# Patient Record
Sex: Male | Born: 1935 | Race: White | Hispanic: No | Smoking: Current every day smoker
Health system: Southern US, Community
[De-identification: ages and names within clinical notes are randomized; demographics above are authoritative.]

## PROBLEM LIST (undated history)

## (undated) DIAGNOSIS — Z720 Tobacco use: Secondary | ICD-10-CM

## (undated) DIAGNOSIS — D049 Carcinoma in situ of skin, unspecified: Secondary | ICD-10-CM

## (undated) DIAGNOSIS — J449 Chronic obstructive pulmonary disease, unspecified: Secondary | ICD-10-CM

## (undated) DIAGNOSIS — G8929 Other chronic pain: Secondary | ICD-10-CM

## (undated) DIAGNOSIS — I10 Essential (primary) hypertension: Secondary | ICD-10-CM

---

## 2017-09-05 ENCOUNTER — Inpatient Hospital Stay (HOSPITAL_COMMUNITY)
Admission: EM | Admit: 2017-09-05 | Discharge: 2017-09-13 | DRG: 082 | Disposition: E | Payer: Medicare Other | Attending: Pulmonary Disease | Admitting: Pulmonary Disease

## 2017-09-05 ENCOUNTER — Emergency Department (HOSPITAL_COMMUNITY): Payer: Medicare Other

## 2017-09-05 ENCOUNTER — Other Ambulatory Visit: Payer: Self-pay

## 2017-09-05 ENCOUNTER — Encounter (HOSPITAL_COMMUNITY): Payer: Self-pay | Admitting: Neurological Surgery

## 2017-09-05 DIAGNOSIS — Z9289 Personal history of other medical treatment: Secondary | ICD-10-CM

## 2017-09-05 DIAGNOSIS — S06369A Traumatic hemorrhage of cerebrum, unspecified, with loss of consciousness of unspecified duration, initial encounter: Secondary | ICD-10-CM | POA: Diagnosis present

## 2017-09-05 DIAGNOSIS — Z66 Do not resuscitate: Secondary | ICD-10-CM | POA: Diagnosis not present

## 2017-09-05 DIAGNOSIS — W07XXXA Fall from chair, initial encounter: Secondary | ICD-10-CM | POA: Diagnosis present

## 2017-09-05 DIAGNOSIS — Y95 Nosocomial condition: Secondary | ICD-10-CM | POA: Diagnosis not present

## 2017-09-05 DIAGNOSIS — R579 Shock, unspecified: Secondary | ICD-10-CM | POA: Diagnosis not present

## 2017-09-05 DIAGNOSIS — Z7189 Other specified counseling: Secondary | ICD-10-CM | POA: Diagnosis not present

## 2017-09-05 DIAGNOSIS — R197 Diarrhea, unspecified: Secondary | ICD-10-CM

## 2017-09-05 DIAGNOSIS — F17210 Nicotine dependence, cigarettes, uncomplicated: Secondary | ICD-10-CM | POA: Diagnosis present

## 2017-09-05 DIAGNOSIS — I959 Hypotension, unspecified: Secondary | ICD-10-CM | POA: Diagnosis present

## 2017-09-05 DIAGNOSIS — I10 Essential (primary) hypertension: Secondary | ICD-10-CM | POA: Diagnosis not present

## 2017-09-05 DIAGNOSIS — S066X9A Traumatic subarachnoid hemorrhage with loss of consciousness of unspecified duration, initial encounter: Principal | ICD-10-CM | POA: Diagnosis present

## 2017-09-05 DIAGNOSIS — R4781 Slurred speech: Secondary | ICD-10-CM | POA: Diagnosis present

## 2017-09-05 DIAGNOSIS — R29810 Facial weakness: Secondary | ICD-10-CM | POA: Diagnosis present

## 2017-09-05 DIAGNOSIS — I472 Ventricular tachycardia: Secondary | ICD-10-CM | POA: Diagnosis not present

## 2017-09-05 DIAGNOSIS — D649 Anemia, unspecified: Secondary | ICD-10-CM | POA: Diagnosis present

## 2017-09-05 DIAGNOSIS — R569 Unspecified convulsions: Secondary | ICD-10-CM | POA: Diagnosis present

## 2017-09-05 DIAGNOSIS — G9349 Other encephalopathy: Secondary | ICD-10-CM | POA: Diagnosis present

## 2017-09-05 DIAGNOSIS — J690 Pneumonitis due to inhalation of food and vomit: Secondary | ICD-10-CM | POA: Diagnosis not present

## 2017-09-05 DIAGNOSIS — I619 Nontraumatic intracerebral hemorrhage, unspecified: Secondary | ICD-10-CM | POA: Diagnosis not present

## 2017-09-05 DIAGNOSIS — I878 Other specified disorders of veins: Secondary | ICD-10-CM | POA: Diagnosis present

## 2017-09-05 DIAGNOSIS — G9340 Encephalopathy, unspecified: Secondary | ICD-10-CM | POA: Diagnosis not present

## 2017-09-05 DIAGNOSIS — I609 Nontraumatic subarachnoid hemorrhage, unspecified: Secondary | ICD-10-CM | POA: Diagnosis not present

## 2017-09-05 DIAGNOSIS — E876 Hypokalemia: Secondary | ICD-10-CM | POA: Diagnosis not present

## 2017-09-05 DIAGNOSIS — J9622 Acute and chronic respiratory failure with hypercapnia: Secondary | ICD-10-CM

## 2017-09-05 DIAGNOSIS — J9601 Acute respiratory failure with hypoxia: Secondary | ICD-10-CM | POA: Diagnosis not present

## 2017-09-05 DIAGNOSIS — S065X9A Traumatic subdural hemorrhage with loss of consciousness of unspecified duration, initial encounter: Secondary | ICD-10-CM | POA: Diagnosis not present

## 2017-09-05 DIAGNOSIS — J449 Chronic obstructive pulmonary disease, unspecified: Secondary | ICD-10-CM | POA: Diagnosis not present

## 2017-09-05 DIAGNOSIS — Z79891 Long term (current) use of opiate analgesic: Secondary | ICD-10-CM

## 2017-09-05 DIAGNOSIS — R4182 Altered mental status, unspecified: Secondary | ICD-10-CM | POA: Diagnosis present

## 2017-09-05 DIAGNOSIS — I4891 Unspecified atrial fibrillation: Secondary | ICD-10-CM

## 2017-09-05 DIAGNOSIS — R55 Syncope and collapse: Secondary | ICD-10-CM | POA: Diagnosis not present

## 2017-09-05 DIAGNOSIS — Y92099 Unspecified place in other non-institutional residence as the place of occurrence of the external cause: Secondary | ICD-10-CM | POA: Diagnosis not present

## 2017-09-05 DIAGNOSIS — B9689 Other specified bacterial agents as the cause of diseases classified elsewhere: Secondary | ICD-10-CM | POA: Diagnosis present

## 2017-09-05 DIAGNOSIS — R6520 Severe sepsis without septic shock: Secondary | ICD-10-CM | POA: Diagnosis not present

## 2017-09-05 DIAGNOSIS — A419 Sepsis, unspecified organism: Secondary | ICD-10-CM | POA: Diagnosis not present

## 2017-09-05 DIAGNOSIS — G8929 Other chronic pain: Secondary | ICD-10-CM | POA: Diagnosis present

## 2017-09-05 DIAGNOSIS — R296 Repeated falls: Secondary | ICD-10-CM | POA: Diagnosis present

## 2017-09-05 DIAGNOSIS — S01112A Laceration without foreign body of left eyelid and periocular area, initial encounter: Secondary | ICD-10-CM | POA: Diagnosis present

## 2017-09-05 DIAGNOSIS — S065XAA Traumatic subdural hemorrhage with loss of consciousness status unknown, initial encounter: Secondary | ICD-10-CM

## 2017-09-05 DIAGNOSIS — Z515 Encounter for palliative care: Secondary | ICD-10-CM | POA: Diagnosis not present

## 2017-09-05 DIAGNOSIS — E872 Acidosis: Secondary | ICD-10-CM | POA: Diagnosis present

## 2017-09-05 HISTORY — DX: Essential (primary) hypertension: I10

## 2017-09-05 HISTORY — DX: Chronic obstructive pulmonary disease, unspecified: J44.9

## 2017-09-05 HISTORY — DX: Carcinoma in situ of skin, unspecified: D04.9

## 2017-09-05 HISTORY — DX: Tobacco use: Z72.0

## 2017-09-05 HISTORY — DX: Other chronic pain: G89.29

## 2017-09-05 LAB — I-STAT ARTERIAL BLOOD GAS, ED
Acid-base deficit: 2 mmol/L (ref 0.0–2.0)
Bicarbonate: 27.6 mmol/L (ref 20.0–28.0)
O2 SAT: 100 %
PCO2 ART: 67 mmHg — AB (ref 32.0–48.0)
PH ART: 7.222 — AB (ref 7.350–7.450)
TCO2: 30 mmol/L (ref 22–32)
pO2, Arterial: 287 mmHg — ABNORMAL HIGH (ref 83.0–108.0)

## 2017-09-05 LAB — COMPREHENSIVE METABOLIC PANEL
ALT: 20 U/L (ref 17–63)
ANION GAP: 9 (ref 5–15)
AST: 35 U/L (ref 15–41)
Albumin: 3.1 g/dL — ABNORMAL LOW (ref 3.5–5.0)
Alkaline Phosphatase: 135 U/L — ABNORMAL HIGH (ref 38–126)
BILIRUBIN TOTAL: 0.7 mg/dL (ref 0.3–1.2)
BUN: 15 mg/dL (ref 6–20)
CHLORIDE: 105 mmol/L (ref 101–111)
CO2: 24 mmol/L (ref 22–32)
Calcium: 8.6 mg/dL — ABNORMAL LOW (ref 8.9–10.3)
Creatinine, Ser: 0.69 mg/dL (ref 0.61–1.24)
Glucose, Bld: 146 mg/dL — ABNORMAL HIGH (ref 65–99)
POTASSIUM: 3.7 mmol/L (ref 3.5–5.1)
Sodium: 138 mmol/L (ref 135–145)
TOTAL PROTEIN: 6.6 g/dL (ref 6.5–8.1)

## 2017-09-05 LAB — CBC
HEMATOCRIT: 37.6 % — AB (ref 39.0–52.0)
Hemoglobin: 12.2 g/dL — ABNORMAL LOW (ref 13.0–17.0)
MCH: 27.4 pg (ref 26.0–34.0)
MCHC: 32.4 g/dL (ref 30.0–36.0)
MCV: 84.5 fL (ref 78.0–100.0)
Platelets: 190 10*3/uL (ref 150–400)
RBC: 4.45 MIL/uL (ref 4.22–5.81)
RDW: 15.8 % — AB (ref 11.5–15.5)
WBC: 9 10*3/uL (ref 4.0–10.5)

## 2017-09-05 LAB — CBG MONITORING, ED: GLUCOSE-CAPILLARY: 160 mg/dL — AB (ref 65–99)

## 2017-09-05 LAB — ETHANOL: Alcohol, Ethyl (B): <10 mg/dL (ref <10)

## 2017-09-05 LAB — DIFFERENTIAL
BASOS ABS: 0.1 10*3/uL (ref 0.0–0.1)
BASOS PCT: 1 %
EOS ABS: 0.2 10*3/uL (ref 0.0–0.7)
EOS PCT: 2 %
Lymphocytes Relative: 31 %
Lymphs Abs: 2.8 10*3/uL (ref 0.7–4.0)
MONOS PCT: 6 %
Monocytes Absolute: 0.6 10*3/uL (ref 0.1–1.0)
Neutro Abs: 5.4 10*3/uL (ref 1.7–7.7)
Neutrophils Relative %: 60 %

## 2017-09-05 LAB — RAPID URINE DRUG SCREEN, HOSP PERFORMED
Amphetamines: NOT DETECTED
BARBITURATES: NOT DETECTED
BENZODIAZEPINES: NOT DETECTED
COCAINE: NOT DETECTED
OPIATES: NOT DETECTED
TETRAHYDROCANNABINOL: NOT DETECTED

## 2017-09-05 LAB — I-STAT CHEM 8, ED
BUN: 14 mg/dL (ref 6–20)
CALCIUM ION: 1.15 mmol/L (ref 1.15–1.40)
CREATININE: 0.6 mg/dL — AB (ref 0.61–1.24)
Chloride: 105 mmol/L (ref 101–111)
GLUCOSE: 149 mg/dL — AB (ref 65–99)
HCT: 36 % — ABNORMAL LOW (ref 39.0–52.0)
HEMOGLOBIN: 12.2 g/dL — AB (ref 13.0–17.0)
Potassium: 3.8 mmol/L (ref 3.5–5.1)
Sodium: 141 mmol/L (ref 135–145)
TCO2: 28 mmol/L (ref 22–32)

## 2017-09-05 LAB — APTT: APTT: 27 s (ref 24–36)

## 2017-09-05 LAB — I-STAT TROPONIN, ED: TROPONIN I, POC: 0 ng/mL (ref 0.00–0.08)

## 2017-09-05 LAB — PROTIME-INR
INR: 1.01
Prothrombin Time: 13.2 seconds (ref 11.4–15.2)

## 2017-09-05 LAB — TRIGLYCERIDES: Triglycerides: 91 mg/dL (ref <150)

## 2017-09-05 MED ORDER — SODIUM CHLORIDE 0.9 % IV SOLN
2000.0000 mg | Freq: Once | INTRAVENOUS | Status: AC
  Administered 2017-09-05: 2000 mg via INTRAVENOUS
  Filled 2017-09-05: qty 20

## 2017-09-05 MED ORDER — PANTOPRAZOLE SODIUM 40 MG IV SOLR
40.0000 mg | Freq: Every day | INTRAVENOUS | Status: DC
  Administered 2017-09-06 – 2017-09-08 (×4): 40 mg via INTRAVENOUS
  Filled 2017-09-05 (×5): qty 40

## 2017-09-05 MED ORDER — SODIUM CHLORIDE 0.9 % IV SOLN
INTRAVENOUS | Status: DC
  Administered 2017-09-06 – 2017-09-07 (×2): via INTRAVENOUS

## 2017-09-05 MED ORDER — IPRATROPIUM BROMIDE 0.02 % IN SOLN
0.5000 mg | Freq: Three times a day (TID) | RESPIRATORY_TRACT | Status: DC
  Administered 2017-09-06 – 2017-09-09 (×12): 0.5 mg via RESPIRATORY_TRACT
  Filled 2017-09-05 (×12): qty 2.5

## 2017-09-05 MED ORDER — SODIUM CHLORIDE 0.9 % IV SOLN: 250.0000 mL | INTRAVENOUS | Status: DC | PRN

## 2017-09-05 MED ORDER — ROCURONIUM BROMIDE 50 MG/5ML IV SOLN
100.0000 mg | Freq: Once | INTRAVENOUS | Status: AC
  Administered 2017-09-05: 100 mg via INTRAVENOUS

## 2017-09-05 MED ORDER — METOPROLOL TARTRATE 5 MG/5ML IV SOLN
2.5000 mg | INTRAVENOUS | Status: DC | PRN
  Filled 2017-09-05: qty 5

## 2017-09-05 MED ORDER — SODIUM CHLORIDE 0.9 % IV SOLN
500.0000 mg | Freq: Two times a day (BID) | INTRAVENOUS | Status: DC
  Administered 2017-09-06 – 2017-09-09 (×8): 500 mg via INTRAVENOUS
  Filled 2017-09-05 (×10): qty 5

## 2017-09-05 MED ORDER — FENTANYL CITRATE (PF) 100 MCG/2ML IJ SOLN
50.0000 ug | INTRAMUSCULAR | Status: DC | PRN
  Administered 2017-09-06 – 2017-09-07 (×6): 50 ug via INTRAVENOUS
  Filled 2017-09-05 (×5): qty 2

## 2017-09-05 MED ORDER — LORAZEPAM 2 MG/ML IJ SOLN
1.0000 mg | INTRAMUSCULAR | Status: DC | PRN
  Administered 2017-09-08: 2 mg via INTRAVENOUS
  Filled 2017-09-05: qty 1

## 2017-09-05 MED ORDER — PROPOFOL 1000 MG/100ML IV EMUL
5.0000 ug/kg/min | Freq: Once | INTRAVENOUS | Status: AC
  Administered 2017-09-05: 5 ug/kg/min via INTRAVENOUS
  Filled 2017-09-05: qty 100

## 2017-09-05 MED ORDER — BUDESONIDE 0.5 MG/2ML IN SUSP
0.5000 mg | Freq: Two times a day (BID) | RESPIRATORY_TRACT | Status: DC
Start: 2017-09-05 — End: 2017-09-09
  Administered 2017-09-06 – 2017-09-09 (×8): 0.5 mg via RESPIRATORY_TRACT
  Filled 2017-09-05 (×9): qty 2

## 2017-09-05 MED ORDER — LORAZEPAM 2 MG/ML IJ SOLN
1.0000 mg | Freq: Once | INTRAMUSCULAR | Status: AC
  Administered 2017-09-05: 1 mg via INTRAVENOUS

## 2017-09-05 MED ORDER — FENTANYL CITRATE (PF) 100 MCG/2ML IJ SOLN
50.0000 ug | INTRAMUSCULAR | Status: DC | PRN
  Administered 2017-09-07: 50 ug via INTRAVENOUS

## 2017-09-05 MED ORDER — ETOMIDATE 2 MG/ML IV SOLN
10.0000 mg | Freq: Once | INTRAVENOUS | Status: AC
  Administered 2017-09-05: 10 mg via INTRAVENOUS

## 2017-09-05 MED ORDER — ONDANSETRON HCL 4 MG/2ML IJ SOLN
4.0000 mg | Freq: Once | INTRAMUSCULAR | Status: AC
  Administered 2017-09-05: 4 mg via INTRAVENOUS
  Filled 2017-09-05: qty 2

## 2017-09-05 MED ORDER — IOPAMIDOL (ISOVUE-370) INJECTION 76%
INTRAVENOUS | Status: AC
  Administered 2017-09-05: 50 mL
  Filled 2017-09-05: qty 50

## 2017-09-05 MED ORDER — SODIUM CHLORIDE 0.9 % IV SOLN: 1000.0000 mg | Freq: Once | INTRAVENOUS | Status: DC

## 2017-09-05 MED ORDER — PROPOFOL 1000 MG/100ML IV EMUL
0.0000 ug/kg/min | INTRAVENOUS | Status: DC
  Administered 2017-09-06: 25 ug/kg/min via INTRAVENOUS
  Filled 2017-09-05: qty 100

## 2017-09-05 MED ORDER — LORAZEPAM 2 MG/ML IJ SOLN
INTRAMUSCULAR | Status: AC
Start: 2017-09-05 — End: 2017-09-06
  Filled 2017-09-05: qty 1

## 2017-09-05 MED ORDER — ARFORMOTEROL TARTRATE 15 MCG/2ML IN NEBU
15.0000 ug | INHALATION_SOLUTION | Freq: Two times a day (BID) | RESPIRATORY_TRACT | Status: DC
  Administered 2017-09-06 – 2017-09-09 (×8): 15 ug via RESPIRATORY_TRACT
  Filled 2017-09-05 (×9): qty 2

## 2017-09-05 MED ORDER — IPRATROPIUM BROMIDE 0.02 % IN SOLN
0.5000 mg | Freq: Three times a day (TID) | RESPIRATORY_TRACT | Status: DC
  Administered 2017-09-05: 0.5 mg via RESPIRATORY_TRACT
  Filled 2017-09-05: qty 2.5

## 2017-09-05 NOTE — Consult Note (Signed)
Reason for Consult:SAH, SDH Referring Physician: EDP  Douglas JanWilliam Arnold is an 84 y.o. male.   HPI:  84 yo male seen in ER today with slurred speech and facial droop. Last seen normal some time yesterday by his family. Has had multiple recent falls and admitted to SNF in the last two weeks. [facility] SeatFell again there today and had some confusion with slurred speech. Nonverbal and following some simple commands on arrival to the ED. Ct showed SAH and SDH, recommended CTA. Pt had a sz and was sedated and intubated, therefore no history or exam available. History taken by EDP and nursing staff.   History reviewed. No pertinent past medical history.  History reviewed. No pertinent surgical history.  Not on File  Social History   Tobacco Use  . Smoking status: Not on file  Substance Use Topics  . Alcohol use: Not on file    History reviewed. No pertinent family history.   Review of Systems  Positive ROS: unable to obtain  All other systems have been reviewed and were otherwise negative with the exception of those mentioned in the HPI and as above.  Objective: Vital signs in last 24 hours: Temp:  [97.9 F (36.6 C)] 97.9 F (36.6 C) (01/24 1939) Pulse Rate:  [93-101] 101 (01/24 2051) Resp:  [17-18] 17 (01/24 2051) BP: (167)/(87) 167/87 (01/24 1939) SpO2:  [92 %-100 %] 100 % (01/24 2051) FiO2 (%):  [100 %] 100 % (01/24 2051) Weight:  [78.8 kg (173 lb 11.6 oz)-81.6 kg (179 lb 14.3 oz)] 78.8 kg (173 lb 11.6 oz) (01/24 2101)  General Appearance: intubated and sedated Head: Normocephalic, without obvious abnormality, atraumatic Eyes: PERRL      NEUROLOGIC:   Mental status: intubated and sedated Motor Exam - unable to obtain Sensory Exam -unable to obtain Reflexes: Gait -not tested Balance - not tested Cranial Nerves: I: smell Not tested  II: visual acuity  OS: na    OD: na  II: visual fields   II: pupils   III,VII: ptosis   III,IV,VI: extraocular muscles    V: mastication   V: facial  light touch sensation    V,VII: corneal reflex    VII: facial muscle function - upper    VII: facial muscle function - lower   VIII: hearing   IX: soft palate elevation    IX,X: gag reflex present  XI: trapezius strength    XI: sternocleidomastoid strength   XI: neck flexion strength    XII: tongue strength      Data Review Lab Results  Component Value Date   WBC 9.0 08/22/2017   HGB 12.2 (L) 08/31/2017   HCT 36.0 (L) 08/27/2017   MCV 84.5 09/04/2017   PLT 190 09/12/2017   Lab Results  Component Value Date   NA 141 09/04/2017   K 3.8 08/28/2017   CL 105 09/04/2017   CO2 24 08/25/2017   BUN 14 08/25/2017   CREATININE 0.60 (L) 09/04/2017   GLUCOSE 149 (H) 08/27/2017   Lab Results  Component Value Date   INR 1.01 08/28/2017    Radiology: Ct Cervical Spine Wo Contrast  Result Date: 09/06/2017 CLINICAL DATA:  Code stroke. EXAM: CT HEAD WITHOUT CONTRAST CT CERVICAL SPINE WITHOUT CONTRAST TECHNIQUE: Contiguous axial images were obtained from the base of the skull through the vertex without intravenous contrast. Continuous axial images of the cervical spine without contrast. COMPARISON:  None. FINDINGS: CT HEAD FINDINGS Brain: Large amount of subarachnoid hemorrhage, predominantly within the left sylvian fissure  and basal cisterns. There is also subarachnoid blood over the right convexity and within the right sylvian fissure and overlying both frontal lobes. There is mild volume loss with confluent periventricular hypoattenuation. No mass effect or hydrocephalus. No intraventricular extension of hemorrhage. Vascular: The distal basilar artery is hyperdense, but is difficult to separate from adjacent hemorrhage on some of the images. No advanced atherosclerotic calcification of the arteries at the skull base. Skull: Posterior left parietal scalp hematoma.  No skull fracture. Sinuses/Orbits: No sinus fluid levels or advanced mucosal thickening. No mastoid effusion. Normal orbits.  CERVICAL SPINE FINDINGS Alignment: No static subluxation. Facets are aligned. Occipital condyles and the lateral masses of C1 and C2 are normally approximated. Skull base and vertebrae: Intermediate height loss of the C7 vertebra without adjacent soft tissue abnormality. No other evidence of fracture. Soft tissues and spinal canal: No prevertebral fluid or swelling. No visible canal hematoma. Disc levels: There is multilevel facet hypertrophy, worst at right C2-3 and C3-4. No bony spinal canal stenosis. Upper chest: No pneumothorax, pulmonary nodule or pleural effusion. Other: Normal visualized paraspinal cervical soft tissues. IMPRESSION: 1. Large volume subarachnoid hemorrhage, predominantly located in the anterior basal cisterns and left sylvian fissure, but also layering over both frontal lobes in the lateral right convexity, extending into the right sylvian fissure. In the context of fall with head trauma, it is possible that this is a posttraumatic subarachnoid hemorrhage; however, the amount of blood in the basal cisterns and sylvian fissures with the hyperdense appearance of the distal basilar artery is concerning for ruptured aneurysm. CTA is recommended. 2. No significant mass effect or hydrocephalus. No intraventricular extension of blood. 3. Compression deformity of the C7 vertebra is suspected to be chronic. No acute fracture or static subluxation of the cervical spine. Critical Value/emergent results were called by telephone at the time of interpretation on 09/12/2017 at 7:23 pm to Dr. Wilford Corner, who verbally acknowledged these results. Electronically Signed   By: Deatra Robinson M.D.   On: 08/18/2017 19:24   Dg Chest Portable 1 View  Result Date: 09/11/2017 CLINICAL DATA:  Status post intubation EXAM: PORTABLE CHEST 1 VIEW COMPARISON:  08/19/2017 FINDINGS: Cardiac shadow is stable. Endotracheal tube and nasogastric catheter are seen. The nasogastric catheter extends beneath the edge of the film. The  endotracheal tube is noted at the level of the carina and should be withdrawn 1-2 cm. Lungs are well aerated bilaterally. Mild interstitial changes are again seen and stable. No bony abnormality is noted. IMPRESSION: Stable interstitial changes. Endotracheal tube at the level of the carina. This should be withdrawn 1-2 cm. Electronically Signed   By: Alcide Clever M.D.   On: 09/03/2017 21:00   Dg Chest Port 1 View  Result Date: 08/28/2017 CLINICAL DATA:  Cough and shortness of Breath EXAM: PORTABLE CHEST 1 VIEW COMPARISON:  None. FINDINGS: Cardiac shadow is within normal limits. Mild interstitial changes are seen without focal infiltrate. Pleuroparenchymal scarring is noted in the apices bilaterally. No acute bony abnormality is seen. IMPRESSION: No acute abnormality noted. Electronically Signed   By: Alcide Clever M.D.   On: 09/04/2017 19:53   Ct Head Code Stroke Wo Contrast  Result Date: 08/25/2017 CLINICAL DATA:  Code stroke. EXAM: CT HEAD WITHOUT CONTRAST CT CERVICAL SPINE WITHOUT CONTRAST TECHNIQUE: Contiguous axial images were obtained from the base of the skull through the vertex without intravenous contrast. Continuous axial images of the cervical spine without contrast. COMPARISON:  None. FINDINGS: CT HEAD FINDINGS Brain: Large amount of  subarachnoid hemorrhage, predominantly within the left sylvian fissure and basal cisterns. There is also subarachnoid blood over the right convexity and within the right sylvian fissure and overlying both frontal lobes. There is mild volume loss with confluent periventricular hypoattenuation. No mass effect or hydrocephalus. No intraventricular extension of hemorrhage. Vascular: The distal basilar artery is hyperdense, but is difficult to separate from adjacent hemorrhage on some of the images. No advanced atherosclerotic calcification of the arteries at the skull base. Skull: Posterior left parietal scalp hematoma.  No skull fracture. Sinuses/Orbits: No sinus fluid  levels or advanced mucosal thickening. No mastoid effusion. Normal orbits. CERVICAL SPINE FINDINGS Alignment: No static subluxation. Facets are aligned. Occipital condyles and the lateral masses of C1 and C2 are normally approximated. Skull base and vertebrae: Intermediate height loss of the C7 vertebra without adjacent soft tissue abnormality. No other evidence of fracture. Soft tissues and spinal canal: No prevertebral fluid or swelling. No visible canal hematoma. Disc levels: There is multilevel facet hypertrophy, worst at right C2-3 and C3-4. No bony spinal canal stenosis. Upper chest: No pneumothorax, pulmonary nodule or pleural effusion. Other: Normal visualized paraspinal cervical soft tissues. IMPRESSION: 1. Large volume subarachnoid hemorrhage, predominantly located in the anterior basal cisterns and left sylvian fissure, but also layering over both frontal lobes in the lateral right convexity, extending into the right sylvian fissure. In the context of fall with head trauma, it is possible that this is a posttraumatic subarachnoid hemorrhage; however, the amount of blood in the basal cisterns and sylvian fissures with the hyperdense appearance of the distal basilar artery is concerning for ruptured aneurysm. CTA is recommended. 2. No significant mass effect or hydrocephalus. No intraventricular extension of blood. 3. Compression deformity of the C7 vertebra is suspected to be chronic. No acute fracture or static subluxation of the cervical spine. Critical Value/emergent results were called by telephone at the time of interpretation on 09/20/17 at 7:23 pm to Dr. Wilford Corner, who verbally acknowledged these results. Electronically Signed   By: Deatra Robinson M.D.   On: 09-20-2017 19:24     Assessment/Plan: 84 year old with progressive decline in cognition and gait stability with multiple falls, now with traumatic SAH and SDH and frontal contusions. Awaiting CTA results. Prognosis for functional recovery is  poor. We do not feel he is a candidate for intervention if he does have an aneurysm. I have spoken to our neurovascular surgeon. We do not see a surgical lesion otherwise. Recommend admission to medical service for supportive management and comfort care.    Douglas Arnold Sep 20, 2017 9:57 PM

## 2017-09-05 NOTE — ED Triage Notes (Signed)
Patient arrived via EMS as Code Stroke at [phone].  PT from ALF, is disoriented upon arrival, no family at bedside, LSN per was at 12noon this day. Code Stroke cancelled at [id number]

## 2017-09-05 NOTE — ED Provider Notes (Signed)
[facility] EMERGENCY DEPARTMENT Provider Note   CSN: [id number] Arrival date & time: 08/16/2017  1854     History   Chief Complaint Chief Complaint  Patient presents with  . Code Stroke    HPI Douglas Arnold is a 84 y.o. male.  HPI  The patient is an 85 year old male much is known about this gentleman other than he is coming from a nursing facility where he lives in assisted care, he was reportedly at the dinner table when he had a witnessed fall as he tried to get up from his seat, fell backwards and struck his head.  There was no evidence of seizure activity however it was noticed after that point that the patient was having slurred speech, facial droop and not responding normally.  It is unclear when he was last seen normal but it seems like it may have been last night.  Nobody noticed any slurred speech earlier in the day however there is no other historians here in the primary history comes from the paramedics report.  They do report that he was recently admitted to an outlying hospital.  He does come with some records from that admission.  History reviewed. No pertinent past medical history.  Patient Active Problem List   Diagnosis Date Noted  . SAH (subarachnoid hemorrhage) (HCC) 08/17/2017    History reviewed. No pertinent surgical history.     Home Medications    Prior to Admission medications   Not on File    Family History History reviewed. No pertinent family history.  Social History Social History   Tobacco Use  . Smoking status: Not on file  Substance Use Topics  . Alcohol use: Not on file  . Drug use: Not on file     Allergies   Patient has no allergy information on record.   Review of Systems Review of Systems  Unable to perform ROS: Mental status change     Physical Exam Updated Vital Signs BP (!) 167/87 (BP Location: Right Arm)   Pulse (!) 101   Temp 97.9 F (36.6 C) (Axillary)   Resp 17   Wt 78.8 kg (173 lb  11.6 oz)   SpO2 98%   Physical Exam  Constitutional: He appears well-developed and well-nourished. No distress.  HENT:  Head: Normocephalic.  Mouth/Throat: Oropharynx is clear and moist. No oropharyngeal exudate.  Small hematoma to the posterior occiput on the left  Eyes: Conjunctivae and EOM are normal. Pupils are equal, round, and reactive to light. Right eye exhibits no discharge. Left eye exhibits no discharge. No scleral icterus.  Neck: Normal range of motion. Neck supple. No JVD present. No thyromegaly present.  Cardiovascular: Normal rate, regular rhythm, normal heart sounds and intact distal pulses. Exam reveals no gallop and no friction rub.  No murmur heard. Pulmonary/Chest: Effort normal and breath sounds normal. No respiratory distress. He has no wheezes. He has no rales.  Gurgling sounds when he breathes but does not appear to be in distress other than mild tachypnea  Abdominal: Soft. Bowel sounds are normal. He exhibits no distension and no mass. There is no tenderness.  Musculoskeletal: Normal range of motion. He exhibits no edema or tenderness.  The patient has no obvious deformities that we does have severe swelling of his legs left greater than right with pitting edema  Lymphadenopathy:    He has no cervical adenopathy.  Neurological: He is alert.  The patient is ignoring some from his left vision, he is able  to say the word "what", he is able to lift both of his arms and they seem symmetrical, he does not move either of his legs spontaneously but does the pain, he does have left-sided facial droop  Skin: Skin is warm and dry. No rash noted. No erythema.  Psychiatric: He has a normal mood and affect. His behavior is normal.  Nursing note and vitals reviewed.    ED Treatments / Results  Labs (all labs ordered are listed, but only abnormal results are displayed) Labs Reviewed  CBC - Abnormal; Notable for the following components:      Result Value   Hemoglobin 12.2  (*)    HCT 37.6 (*)    RDW 15.8 (*)    All other components within normal limits  COMPREHENSIVE METABOLIC PANEL - Abnormal; Notable for the following components:   Glucose, Bld 146 (*)    Calcium 8.6 (*)    Albumin 3.1 (*)    Alkaline Phosphatase 135 (*)    All other components within normal limits  CBG MONITORING, ED - Abnormal; Notable for the following components:   Glucose-Capillary 160 (*)    All other components within normal limits  I-STAT CHEM 8, ED - Abnormal; Notable for the following components:   Creatinine, Ser 0.60 (*)    Glucose, Bld 149 (*)    Hemoglobin 12.2 (*)    HCT 36.0 (*)    All other components within normal limits  I-STAT ARTERIAL BLOOD GAS, ED - Abnormal; Notable for the following components:   pH, Arterial 7.222 (*)    pCO2 arterial 67.0 (*)    pO2, Arterial 287.0 (*)    All other components within normal limits  PROTIME-INR  APTT  DIFFERENTIAL  ETHANOL  RAPID URINE DRUG SCREEN, HOSP PERFORMED  URINALYSIS, ROUTINE W REFLEX MICROSCOPIC  CBC  BASIC METABOLIC PANEL  BLOOD GAS, ARTERIAL  MAGNESIUM  PHOSPHORUS  TRIGLYCERIDES  I-STAT TROPONIN, ED  I-STAT CHEM 8, ED  I-STAT TROPONIN, ED    EKG  EKG Interpretation  Date/Time:  Thursday September 05 2017 19:39:09 EST Ventricular Rate:  103 PR Interval:    QRS Duration: 91 QT Interval:  379 QTC Calculation: 474 R Axis:   67 Text Interpretation:  Sinus tachycardia Paired ventricular premature complexes Prolonged PR interval Consider left atrial enlargement Abnormal R-wave progression, early transition Minimal ST depression, anterior leads Confirmed by Eber HongMiller, Tamyra Fojtik ([id number]) on 09/08/2017 7:44:18 PM        Radiology Ct Angio Head W Or Wo Contrast  Result Date: 09/04/2017 CLINICAL DATA:  Intracranial hemorrhage EXAM: CT ANGIOGRAPHY HEAD TECHNIQUE: Multidetector CT imaging of the head was performed using the standard protocol during bolus administration of intravenous contrast. Multiplanar CT image  reconstructions and MIPs were obtained to evaluate the vascular anatomy. CONTRAST:  50mL ISOVUE-370 IOPAMIDOL (ISOVUE-370) INJECTION 76% COMPARISON:  Head CT 08/23/2017 FINDINGS: CTA HEAD Brain: There has developed an intraparenchymal hematoma centered in the right frontal lobe measuring 3.8 x 1.8 cm. There are multiple smaller foci of intraparenchymal blood in the right frontal lobe more laterally. There is leftward bulging of the falx cerebri, measuring 5 mm. Small intraparenchymal hemorrhage in the right temporal lobe. Anterior circulation: --Intracranial internal carotid arteries: Atherosclerotic calcification of both internal carotid arteries at the skull base. --Anterior cerebral arteries: Normal. --Middle cerebral arteries: Normal. --Posterior communicating arteries: Absent bilaterally. Posterior circulation: --Posterior cerebral arteries: Normal. --Superior cerebellar arteries: Normal. --Basilar artery: Normal. --Anterior inferior cerebellar arteries: Normal. --Posterior inferior cerebellar arteries: Normal. Vertebral arteries:  Right-dominant. Mild-to-moderate atherosclerotic narrowing of the left vertebral artery. Venous sinuses: As permitted by contrast timing, patent. Anatomic variants: None Delayed phase: No abnormal intracranial enhancement. IMPRESSION: 1. Interval blooming of hemorrhagic contusion in the right frontal lobe and right temporal lobe with the largest area of intraparenchymal hematoma measuring up to 3.8 cm and located in the right frontal lobe. There is leftward bulging of the cerebral falx, measuring 5 mm right to left. 2. No aneurysm. The subarachnoid hemorrhage is therefore most likely posttraumatic. 3. No emergent large vessel occlusion or high-grade intracranial stenosis. Bilateral internal carotid artery atherosclerotic calcification without flow limiting stenosis. Critical Value/emergent results were called by telephone at the time of interpretation on 08/29/2017 at 10:16 pm to Dr.  Eber Hong , who verbally acknowledged these results. Electronically Signed   By: Deatra Robinson M.D.   On: 09/11/2017 22:19   Ct Cervical Spine Wo Contrast  Result Date: 08/31/2017 CLINICAL DATA:  Code stroke. EXAM: CT HEAD WITHOUT CONTRAST CT CERVICAL SPINE WITHOUT CONTRAST TECHNIQUE: Contiguous axial images were obtained from the base of the skull through the vertex without intravenous contrast. Continuous axial images of the cervical spine without contrast. COMPARISON:  None. FINDINGS: CT HEAD FINDINGS Brain: Large amount of subarachnoid hemorrhage, predominantly within the left sylvian fissure and basal cisterns. There is also subarachnoid blood over the right convexity and within the right sylvian fissure and overlying both frontal lobes. There is mild volume loss with confluent periventricular hypoattenuation. No mass effect or hydrocephalus. No intraventricular extension of hemorrhage. Vascular: The distal basilar artery is hyperdense, but is difficult to separate from adjacent hemorrhage on some of the images. No advanced atherosclerotic calcification of the arteries at the skull base. Skull: Posterior left parietal scalp hematoma.  No skull fracture. Sinuses/Orbits: No sinus fluid levels or advanced mucosal thickening. No mastoid effusion. Normal orbits. CERVICAL SPINE FINDINGS Alignment: No static subluxation. Facets are aligned. Occipital condyles and the lateral masses of C1 and C2 are normally approximated. Skull base and vertebrae: Intermediate height loss of the C7 vertebra without adjacent soft tissue abnormality. No other evidence of fracture. Soft tissues and spinal canal: No prevertebral fluid or swelling. No visible canal hematoma. Disc levels: There is multilevel facet hypertrophy, worst at right C2-3 and C3-4. No bony spinal canal stenosis. Upper chest: No pneumothorax, pulmonary nodule or pleural effusion. Other: Normal visualized paraspinal cervical soft tissues. IMPRESSION: 1. Large  volume subarachnoid hemorrhage, predominantly located in the anterior basal cisterns and left sylvian fissure, but also layering over both frontal lobes in the lateral right convexity, extending into the right sylvian fissure. In the context of fall with head trauma, it is possible that this is a posttraumatic subarachnoid hemorrhage; however, the amount of blood in the basal cisterns and sylvian fissures with the hyperdense appearance of the distal basilar artery is concerning for ruptured aneurysm. CTA is recommended. 2. No significant mass effect or hydrocephalus. No intraventricular extension of blood. 3. Compression deformity of the C7 vertebra is suspected to be chronic. No acute fracture or static subluxation of the cervical spine. Critical Value/emergent results were called by telephone at the time of interpretation on 08/19/2017 at 7:23 pm to Dr. Wilford Corner, who verbally acknowledged these results. Electronically Signed   By: Deatra Robinson M.D.   On: 08/31/2017 19:24   Dg Chest Portable 1 View  Result Date: 09/06/2017 CLINICAL DATA:  Status post intubation EXAM: PORTABLE CHEST 1 VIEW COMPARISON:  08/23/2017 FINDINGS: Cardiac shadow is stable. Endotracheal tube and nasogastric catheter  are seen. The nasogastric catheter extends beneath the edge of the film. The endotracheal tube is noted at the level of the carina and should be withdrawn 1-2 cm. Lungs are well aerated bilaterally. Mild interstitial changes are again seen and stable. No bony abnormality is noted. IMPRESSION: Stable interstitial changes. Endotracheal tube at the level of the carina. This should be withdrawn 1-2 cm. Electronically Signed   By: Alcide Clever M.D.   On: 10/01/2017 21:00   Dg Chest Port 1 View  Result Date: 10/01/17 CLINICAL DATA:  Cough and shortness of Breath EXAM: PORTABLE CHEST 1 VIEW COMPARISON:  None. FINDINGS: Cardiac shadow is within normal limits. Mild interstitial changes are seen without focal infiltrate.  Pleuroparenchymal scarring is noted in the apices bilaterally. No acute bony abnormality is seen. IMPRESSION: No acute abnormality noted. Electronically Signed   By: Alcide Clever M.D.   On: 10-01-2017 19:53   Ct Head Code Stroke Wo Contrast  Result Date: Oct 01, 2017 CLINICAL DATA:  Code stroke. EXAM: CT HEAD WITHOUT CONTRAST CT CERVICAL SPINE WITHOUT CONTRAST TECHNIQUE: Contiguous axial images were obtained from the base of the skull through the vertex without intravenous contrast. Continuous axial images of the cervical spine without contrast. COMPARISON:  None. FINDINGS: CT HEAD FINDINGS Brain: Large amount of subarachnoid hemorrhage, predominantly within the left sylvian fissure and basal cisterns. There is also subarachnoid blood over the right convexity and within the right sylvian fissure and overlying both frontal lobes. There is mild volume loss with confluent periventricular hypoattenuation. No mass effect or hydrocephalus. No intraventricular extension of hemorrhage. Vascular: The distal basilar artery is hyperdense, but is difficult to separate from adjacent hemorrhage on some of the images. No advanced atherosclerotic calcification of the arteries at the skull base. Skull: Posterior left parietal scalp hematoma.  No skull fracture. Sinuses/Orbits: No sinus fluid levels or advanced mucosal thickening. No mastoid effusion. Normal orbits. CERVICAL SPINE FINDINGS Alignment: No static subluxation. Facets are aligned. Occipital condyles and the lateral masses of C1 and C2 are normally approximated. Skull base and vertebrae: Intermediate height loss of the C7 vertebra without adjacent soft tissue abnormality. No other evidence of fracture. Soft tissues and spinal canal: No prevertebral fluid or swelling. No visible canal hematoma. Disc levels: There is multilevel facet hypertrophy, worst at right C2-3 and C3-4. No bony spinal canal stenosis. Upper chest: No pneumothorax, pulmonary nodule or pleural effusion.  Other: Normal visualized paraspinal cervical soft tissues. IMPRESSION: 1. Large volume subarachnoid hemorrhage, predominantly located in the anterior basal cisterns and left sylvian fissure, but also layering over both frontal lobes in the lateral right convexity, extending into the right sylvian fissure. In the context of fall with head trauma, it is possible that this is a posttraumatic subarachnoid hemorrhage; however, the amount of blood in the basal cisterns and sylvian fissures with the hyperdense appearance of the distal basilar artery is concerning for ruptured aneurysm. CTA is recommended. 2. No significant mass effect or hydrocephalus. No intraventricular extension of blood. 3. Compression deformity of the C7 vertebra is suspected to be chronic. No acute fracture or static subluxation of the cervical spine. Critical Value/emergent results were called by telephone at the time of interpretation on October 01, 2017 at 7:23 pm to Dr. Wilford Corner, who verbally acknowledged these results. Electronically Signed   By: Deatra Robinson M.D.   On: 10/01/2017 19:24    Procedures .Critical Care Performed by: Eber Hong, MD Authorized by: Eber Hong, MD   Critical care provider statement:    Critical care  time (minutes):  80   Critical care time was exclusive of:  Separately billable procedures and treating other patients and teaching time   Critical care was necessary to treat or prevent imminent or life-threatening deterioration of the following conditions:  CNS failure or compromise   Critical care was time spent personally by me on the following activities:  Blood draw for specimens, development of treatment plan with patient or surrogate, discussions with consultants, evaluation of patient's response to treatment, examination of patient, obtaining history from patient or surrogate, ordering and performing treatments and interventions, ordering and review of laboratory studies, ordering and review of radiographic  studies, pulse oximetry, re-evaluation of patient's condition and review of old charts     (including critical care time)  Medications Ordered in ED Medications  LORazepam (ATIVAN) 2 MG/ML injection (not administered)  pantoprazole (PROTONIX) injection 40 mg (not administered)  0.9 %  sodium chloride infusion (not administered)  0.9 %  sodium chloride infusion (not administered)  fentaNYL (SUBLIMAZE) injection 50 mcg (not administered)  fentaNYL (SUBLIMAZE) injection 50 mcg (not administered)  propofol (DIPRIVAN) 1000 MG/100ML infusion (not administered)  levETIRAcetam (KEPPRA) 500 mg in sodium chloride 0.9 % 100 mL IVPB (not administered)  metoprolol tartrate (LOPRESSOR) injection 2.5-5 mg (not administered)  iopamidol (ISOVUE-370) 76 % injection (50 mLs  Contrast Given 08/23/2017 2143)  ondansetron (ZOFRAN) injection 4 mg (4 mg Intravenous Given 09/03/2017 2030)  LORazepam (ATIVAN) injection 1 mg (1 mg Intravenous Given 09/01/2017 2031)  etomidate (AMIDATE) injection 10 mg (10 mg Intravenous Given 08/14/2017 2034)  rocuronium (ZEMURON) injection 100 mg (100 mg Intravenous Given 08/21/2017 2034)  levETIRAcetam (KEPPRA) 2,000 mg in sodium chloride 0.9 % 100 mL IVPB (0 mg Intravenous Stopped 08/23/2017 2103)  LORazepam (ATIVAN) injection 1 mg (1 mg Intravenous Given 08/27/2017 2037)  propofol (DIPRIVAN) 1000 MG/100ML infusion (5 mcg/kg/min  81.6 kg Intravenous New Bag/Given 09/04/2017 2101)    Initial Impression / Assessment and Plan / ED Course  I have reviewed the triage vital signs and the nursing notes.  Pertinent labs & imaging results that were available during my care of the patient were reviewed by me and considered in my medical decision making (see chart for details).     The patient is covered in vomit from EMS ride He has signs of head injuty Unclear if has had stroke Poor baseline with regards to health Neuro Dr. Marisue Humble has seen at bedside and agrees to cancel code stroke - not getting TPA  due to baseline / functioning and recent admit, likely fall risk At CT now.  The patient has had a large amount of subarachnoid and subdural blood, it is unclear whether this is traumatic from the subdural standpoint or if this is an extension of the subarachnoid bleeding however according to the radiologist they feel that this is likely subarachnoid blood.  The neurosurgery team has been consulted, I spoke with Selena Batten of the neurosurgical team who will pass this on and discuss with the neurosurgeon.  I have personally looked at the CT scan of the cervical spine and I do not see any significant fractures of the spine.  The patient is moving all 4 extremities at this time though his mental status is significantly declined with regards to following commands.  He is minimally interactive but awake and alert.  He does still have some left-sided facial droop.  The patient is critically ill with intracranial hemorrhage.  He is not anticoagulated and according to the note from the recent  discharge summary he had been admitted for frequent falls and a cough, they found him to have significant swelling of the legs, no obvious DVT, significant cognitive decline and placed him in a nursing facility at an assisted care 2 weeks ago.  NS recommends CTA - ordered  In the CT scanner during the angiogram the patient started to vomit and then had seizure activity which was tonic-clonic, lasted for several minutes and then resolved.  On arrival back to the room the patient got Ativan, 1 mg, he had significant depressed respiratory status and was not breathing adequately, he required bag valve mask assisted ventilation to oxygenate appropriately, I had a discussion with his daughter-in-law who was appearing as next of kin on the paperwork from his nursing facility who requested that the patient be a full code and states that she had this discussion with him several days ago and he requested that everything be done.  At this time  we will intubate in hopes that the patient will have a recovery though I think it is grim and I discussed this with the family member nevertheless she requested that everything be done  The resident, Dr. Jaquita Rector intubated the patient under my direct supervision, first attempt success.  Post intubation films show appropriate to position  The patient was given propofol for sedation, Keppra, will need to go back to CT angiogram when this is stabilized and his airway status  Dr. Yetta Barre has seen pt and agrees that at this time, there eis no operative intervention that would be apprpriate - ICU admit and await for family.  Discussed the case with neurosurgery as well as with radiology as well as with the intensive care unit physicians will admit to the hospital. Discussed the care with the family member who requested everything be done at this time.  I gave appropriate expectations that Douglas Arnold unfortunately would not do very well however they requested that everything be done at this time until they can come to the hospital.  The patient is critically ill  Final Clinical Impressions(s) / ED Diagnoses   Final diagnoses:  Subdural hematoma (HCC)  Subarachnoid hemorrhage (HCC)  Traumatic hemorrhage of cerebrum with loss of consciousness, unspecified laterality, initial encounter (HCC)      Eber Hong, MD Sep 16, 2017 2243

## 2017-09-05 NOTE — Code Documentation (Addendum)
Patient arrived via EMS from skilled facility. Last Known well at noon.  Per staff he stood up from the dinning room and fell back, hitting head.  Patient vomited en route.  Lungs sounds rhonchi, audible rhonchi.  Alert but not speaking to staff, per report very HOH. Moving all extremities purposefully, not following commands.   Stat CT head and neck done.  Dr Wilford CornerArora at bedside to assess patient.

## 2017-09-05 NOTE — ED Notes (Signed)
Patient does not follow command

## 2017-09-05 NOTE — ED Provider Notes (Signed)
INTUBATION Performed by: Jaquita RectorMichael Barnes Florek  Required items: required blood products, implants, devices, and special equipment available Patient identity confirmed: provided demographic data and hospital-assigned identification number Family was contacted prior to intubation to confirm patient's code status/resuscitation desires.  Indications: Respiratory failure  Intubation method: Direct Laryngoscopy   Preoxygenation: BVM  Sedatives: Etomidate Paralytic: Rocuronium Tube Size: 8 cuffed  Post-procedure assessment: chest rise and ETCO2 monitor Breath sounds: equal and absent over the epigastrium Tube secured with: ETT holder Chest x-ray interpreted by radiologist and me.  Chest x-ray findings: endotracheal tube in appropriate position, slightly deep. Retracted 2 cm.  Patient tolerated the procedure well with no immediate complications.       Garey Hamean, Yohan Samons E, MD 08/22/2017 [id number]    Eber HongMiller, Brian, MD 09/02/2017 [phone]

## 2017-09-05 NOTE — Progress Notes (Addendum)
CODE STROKE NOTE Responded to code stroke. Reviewed imaging with neurorads - aneurysmal SAH and SDH. Recommend Stat NSGY consult. Communicated to the EDP Dr Hyacinth MeekerMiller in person. Please call neurology if we can be of any assistance.  -- Milon DikesAshish Miro Balderson, MD [facility] Pager: [phone] If 7pm to 7am, please call on call as listed on AMION.   No charge note

## 2017-09-05 NOTE — ED Notes (Signed)
[facility] arrived via [facility], disoriented X4. Patient is reported to be from an Assisted Living Facility. EMS reported that staff called because patient stood up from the dinning table, fell back and hit his head. No family at bedside.

## 2017-09-05 NOTE — ED Notes (Signed)
CBG 160 

## 2017-09-05 NOTE — ED Notes (Signed)
Called from CT to come help with pt who is actively vomiting EDP made aware and orders obtained. Upon arrival to CT pt noted to be covered in vomit and seizing. EDP notified and PT brought back to room. Small lac noted to left eye from hitting head on CT table from seizure

## 2017-09-05 NOTE — H&P (Signed)
PULMONARY / CRITICAL CARE MEDICINE   Name: Douglas Arnold MRN: [id number] DOB: 03-14-1927    ADMISSION DATE:  21-Aug-2017 CONSULTATION DATE:  01/08/2018  REFERRING MD:  Hyacinth MeekerMiller  CHIEF COMPLAINT:  AMS  HISTORY OF PRESENT ILLNESS:  Pt is encephelopathic; therefore, this HPI is obtained from chart review. Douglas JanWilliam Brazier is a 84 y.o. male with PMH of COPD, current tobacco dependence, chronic pain with opiate use, b/l hearing loss, confusion, impaired ambulation with multiple recent falls for which was admitted to SNF in the past 2 weeks (just discharged [facility] on 08/17/17).  On 1/24, he had another fall followed by confusion and slurred speech.  He was brought to ED where CT of the head demonstrated SAH and SDH. Following CT, he had a seizure; therefore, he was intubated for airway protection.  Neurosurgery was consulted and do not consider him to be a surgical candidate.  They feel that prognosis is poor and are recommending comfort care.    PAST MEDICAL HISTORY :  He  has no past medical history on file.  PAST SURGICAL HISTORY: He  has no past surgical history on file.  Not on File  No current facility-administered medications on file prior to encounter.    No current outpatient medications on file prior to encounter.    FAMILY HISTORY:  His has no family status information on file.    SOCIAL HISTORY: He    REVIEW OF SYSTEMS:  Unable to obtain as pt is encephalopathic.  SUBJECTIVE:  On vent, unresponsive.  VITAL SIGNS: BP (!) 167/87 (BP Location: Right Arm)   Pulse (!) 101   Temp 97.9 F (36.6 C) (Axillary)   Resp 17   Wt 78.8 kg (173 lb 11.6 oz)   SpO2 100%   HEMODYNAMICS:    VENTILATOR SETTINGS: Vent Mode: PRVC FiO2 (%):  [100 %] 100 % Set Rate:  [18 bmp] 18 bmp Vt Set:  [500 mL] 500 mL PEEP:  [5 cmH20] 5 cmH20 Plateau Pressure:  [17 cmH20] 17 cmH20  INTAKE / OUTPUT: No intake/output data recorded.   PHYSICAL EXAMINATION: General: Elderly male,  critically ill. Neuro:  Unresponsive. HEENT: Left eye with surround hematoma.  MMM. Cardiovascular: IRIR, no M/R/G.  Lungs: Respirations even and unlabored.  CTA bilaterally, No W/R/R. Abdomen: BS x 4, soft, NT/ND.  Musculoskeletal: BLE venous stasis changes, 1+ edema.  Skin: Intact, warm, no rashes.  LABS:  BMET Recent Labs  Lab 005/29/2019 1900 005/29/2019 1915  NA 138 141  K 3.7 3.8  CL 105 105  CO2 24  --   BUN 15 14  CREATININE 0.69 0.60*  GLUCOSE 146* 149*    Electrolytes Recent Labs  Lab 005/29/2019 1900  CALCIUM 8.6*    CBC Recent Labs  Lab 005/29/2019 1900 005/29/2019 1915  WBC 9.0  --   HGB 12.2* 12.2*  HCT 37.6* 36.0*  PLT 190  --     Coag's Recent Labs  Lab 005/29/2019 1900  APTT 27  INR 1.01    Sepsis Markers No results for input(s): LATICACIDVEN, PROCALCITON, O2SATVEN in the last 168 hours.  ABG Recent Labs  Lab 005/29/2019 2207  PHART 7.222*  PCO2ART 67.0*  PO2ART 287.0*    Liver Enzymes Recent Labs  Lab 005/29/2019 1900  AST 35  ALT 20  ALKPHOS 135*  BILITOT 0.7  ALBUMIN 3.1*    Cardiac Enzymes No results for input(s): TROPONINI, PROBNP in the last 168 hours.  Glucose Recent Labs  Lab 005/29/2019 1938  GLUCAP 160*  Imaging Ct Cervical Spine Wo Contrast  Result Date: 08/21/2017 CLINICAL DATA:  Code stroke. EXAM: CT HEAD WITHOUT CONTRAST CT CERVICAL SPINE WITHOUT CONTRAST TECHNIQUE: Contiguous axial images were obtained from the base of the skull through the vertex without intravenous contrast. Continuous axial images of the cervical spine without contrast. COMPARISON:  None. FINDINGS: CT HEAD FINDINGS Brain: Large amount of subarachnoid hemorrhage, predominantly within the left sylvian fissure and basal cisterns. There is also subarachnoid blood over the right convexity and within the right sylvian fissure and overlying both frontal lobes. There is mild volume loss with confluent periventricular hypoattenuation. No mass effect or  hydrocephalus. No intraventricular extension of hemorrhage. Vascular: The distal basilar artery is hyperdense, but is difficult to separate from adjacent hemorrhage on some of the images. No advanced atherosclerotic calcification of the arteries at the skull base. Skull: Posterior left parietal scalp hematoma.  No skull fracture. Sinuses/Orbits: No sinus fluid levels or advanced mucosal thickening. No mastoid effusion. Normal orbits. CERVICAL SPINE FINDINGS Alignment: No static subluxation. Facets are aligned. Occipital condyles and the lateral masses of C1 and C2 are normally approximated. Skull base and vertebrae: Intermediate height loss of the C7 vertebra without adjacent soft tissue abnormality. No other evidence of fracture. Soft tissues and spinal canal: No prevertebral fluid or swelling. No visible canal hematoma. Disc levels: There is multilevel facet hypertrophy, worst at right C2-3 and C3-4. No bony spinal canal stenosis. Upper chest: No pneumothorax, pulmonary nodule or pleural effusion. Other: Normal visualized paraspinal cervical soft tissues. IMPRESSION: 1. Large volume subarachnoid hemorrhage, predominantly located in the anterior basal cisterns and left sylvian fissure, but also layering over both frontal lobes in the lateral right convexity, extending into the right sylvian fissure. In the context of fall with head trauma, it is possible that this is a posttraumatic subarachnoid hemorrhage; however, the amount of blood in the basal cisterns and sylvian fissures with the hyperdense appearance of the distal basilar artery is concerning for ruptured aneurysm. CTA is recommended. 2. No significant mass effect or hydrocephalus. No intraventricular extension of blood. 3. Compression deformity of the C7 vertebra is suspected to be chronic. No acute fracture or static subluxation of the cervical spine. Critical Value/emergent results were called by telephone at the time of interpretation on 09/11/2017 at  7:23 pm to Dr. Wilford Corner, who verbally acknowledged these results. Electronically Signed   By: Deatra Robinson M.D.   On: 08/22/2017 19:24   Dg Chest Portable 1 View  Result Date: 09/03/2017 CLINICAL DATA:  Status post intubation EXAM: PORTABLE CHEST 1 VIEW COMPARISON:  08/19/2017 FINDINGS: Cardiac shadow is stable. Endotracheal tube and nasogastric catheter are seen. The nasogastric catheter extends beneath the edge of the film. The endotracheal tube is noted at the level of the carina and should be withdrawn 1-2 cm. Lungs are well aerated bilaterally. Mild interstitial changes are again seen and stable. No bony abnormality is noted. IMPRESSION: Stable interstitial changes. Endotracheal tube at the level of the carina. This should be withdrawn 1-2 cm. Electronically Signed   By: Alcide Clever M.D.   On: 08/21/2017 21:00   Dg Chest Port 1 View  Result Date: 08/22/2017 CLINICAL DATA:  Cough and shortness of Breath EXAM: PORTABLE CHEST 1 VIEW COMPARISON:  None. FINDINGS: Cardiac shadow is within normal limits. Mild interstitial changes are seen without focal infiltrate. Pleuroparenchymal scarring is noted in the apices bilaterally. No acute bony abnormality is seen. IMPRESSION: No acute abnormality noted. Electronically Signed   By: Alcide Clever  M.D.   On: 08/28/2017 19:53   Ct Head Code Stroke Wo Contrast  Result Date: 08/25/2017 CLINICAL DATA:  Code stroke. EXAM: CT HEAD WITHOUT CONTRAST CT CERVICAL SPINE WITHOUT CONTRAST TECHNIQUE: Contiguous axial images were obtained from the base of the skull through the vertex without intravenous contrast. Continuous axial images of the cervical spine without contrast. COMPARISON:  None. FINDINGS: CT HEAD FINDINGS Brain: Large amount of subarachnoid hemorrhage, predominantly within the left sylvian fissure and basal cisterns. There is also subarachnoid blood over the right convexity and within the right sylvian fissure and overlying both frontal lobes. There is mild volume  loss with confluent periventricular hypoattenuation. No mass effect or hydrocephalus. No intraventricular extension of hemorrhage. Vascular: The distal basilar artery is hyperdense, but is difficult to separate from adjacent hemorrhage on some of the images. No advanced atherosclerotic calcification of the arteries at the skull base. Skull: Posterior left parietal scalp hematoma.  No skull fracture. Sinuses/Orbits: No sinus fluid levels or advanced mucosal thickening. No mastoid effusion. Normal orbits. CERVICAL SPINE FINDINGS Alignment: No static subluxation. Facets are aligned. Occipital condyles and the lateral masses of C1 and C2 are normally approximated. Skull base and vertebrae: Intermediate height loss of the C7 vertebra without adjacent soft tissue abnormality. No other evidence of fracture. Soft tissues and spinal canal: No prevertebral fluid or swelling. No visible canal hematoma. Disc levels: There is multilevel facet hypertrophy, worst at right C2-3 and C3-4. No bony spinal canal stenosis. Upper chest: No pneumothorax, pulmonary nodule or pleural effusion. Other: Normal visualized paraspinal cervical soft tissues. IMPRESSION: 1. Large volume subarachnoid hemorrhage, predominantly located in the anterior basal cisterns and left sylvian fissure, but also layering over both frontal lobes in the lateral right convexity, extending into the right sylvian fissure. In the context of fall with head trauma, it is possible that this is a posttraumatic subarachnoid hemorrhage; however, the amount of blood in the basal cisterns and sylvian fissures with the hyperdense appearance of the distal basilar artery is concerning for ruptured aneurysm. CTA is recommended. 2. No significant mass effect or hydrocephalus. No intraventricular extension of blood. 3. Compression deformity of the C7 vertebra is suspected to be chronic. No acute fracture or static subluxation of the cervical spine. Critical Value/emergent results  were called by telephone at the time of interpretation on 09/09/2017 at 7:23 pm to Dr. Wilford Corner, who verbally acknowledged these results. Electronically Signed   By: Deatra Robinson M.D.   On: 08/23/2017 19:24     STUDIES:  CT head 1/24 > large volume SAH, likely traumatic but possibly from ruptured aneurysm. CTA head 1/24 >   CULTURES: None.  ANTIBIOTICS: None.  SIGNIFICANT EVENTS: 1/24 > admitted.  LINES/TUBES: ETT 1/24 >   DISCUSSION: 84 y.o. male with recent falls who was admitted to SNF 2 weeks ago, admitted 1/24 with [facility] and SDH after another fall.  Had seizure in ED and subsequently intubated.  Neurosurgery consulted who feels that he has poor prognosis; therefore, recommending comfort care.  ASSESSMENT / PLAN:  PULMONARY A: Respiratory insufficiency - due to inability to protect the airway in the setting of SAH with SDH and seizures. Hx COPD with current tobacco dependence. P:   Full vent support. Wean as able. VAP prevention measures. ABG noted, increased respiratory rate given resp acidosis, FiO2 decreased. Albuterol PRN. CXR in AM.  CARDIOVASCULAR A:  A.Fib with RVR. P:  Lopressor PRN. No anticoagulation due to [facility] with SDH.  RENAL A:   No acute issues.  P:   NS @ 75. BMP in AM.  GASTROINTESTINAL A:   GI prophylaxis. Nutrition. P:  SUP: Pantoprazole. NPO.  HEMATOLOGIC A:   VTE Prophylaxis. P:  SCD's only. CBC in AM.  INFECTIOUS A:   No indication of infection. P:   Monitor clinically.  ENDOCRINE A: No acute issues. P: No interventions required.  NEUROLOGIC A:   SAH with SDH - likely post traumatic, but can not rule out ruptured aneurysm. Seizure - presumed due to above. Hx frequent falls (had recent admission to SNF after discharge from [facility] 08/17/17), confusion, chronic pain. P:   Sedation:  Propofol gtt / Fentanyl PRN. RASS goal: 0 to -1. Daily WUA. Continue keppra with PRN ativan. Neurosurgery following, do not feel that pt  is a candidate for surgical intervention. Poor prognosis given by neurosurgery, recommending comfort care. Daughter in law updated by Dr. Hyacinth Meeker, she is on her way to hospital.  Family updated: Daughter in law updated by Dr. Hyacinth Meeker in ED.  She states pt just recently wished for FULL CODE.  She would like to continue with full code at this time.  She is on her way to the hospital and will likely need further discussions regarding goals of care.  Interdisciplinary Family Meeting v Palliative Care Meeting:  Due by: 09/11/17.  CC time: 30 min.   Rutherford Guys, [place] [facility] Pager: [phone]  or [phone] 08/18/2017, 10:10 PM

## 2017-09-06 ENCOUNTER — Inpatient Hospital Stay (HOSPITAL_COMMUNITY): Payer: Medicare Other

## 2017-09-06 DIAGNOSIS — G9340 Encephalopathy, unspecified: Secondary | ICD-10-CM

## 2017-09-06 DIAGNOSIS — R579 Shock, unspecified: Secondary | ICD-10-CM

## 2017-09-06 DIAGNOSIS — R55 Syncope and collapse: Secondary | ICD-10-CM

## 2017-09-06 LAB — LACTIC ACID, PLASMA
LACTIC ACID, VENOUS: 5.9 mmol/L — AB (ref 0.5–1.9)
Lactic Acid, Venous: 5.9 mmol/L (ref 0.5–1.9)
Lactic Acid, Venous: 6.3 mmol/L (ref 0.5–1.9)
Lactic Acid, Venous: 5.7 mmol/L (ref 0.5–1.9)
Lactic Acid, Venous: 5.4 mmol/L (ref 0.5–1.9)
Lactic Acid, Venous: 6.3 mmol/L (ref 0.5–1.9)

## 2017-09-06 LAB — GLUCOSE, CAPILLARY
Glucose-Capillary: 162 mg/dL — ABNORMAL HIGH (ref 65–99)
Glucose-Capillary: 90 mg/dL (ref 65–99)

## 2017-09-06 LAB — BASIC METABOLIC PANEL
ANION GAP: 12 (ref 5–15)
BUN: 12 mg/dL (ref 6–20)
CO2: 20 mmol/L — ABNORMAL LOW (ref 22–32)
Calcium: 8.5 mg/dL — ABNORMAL LOW (ref 8.9–10.3)
Chloride: 105 mmol/L (ref 101–111)
Creatinine, Ser: 0.68 mg/dL (ref 0.61–1.24)
GFR calc Af Amer: >60 mL/min (ref >60)
GFR calc non Af Amer: >60 mL/min (ref >60)
GLUCOSE: 131 mg/dL — AB (ref 65–99)
POTASSIUM: 3.6 mmol/L (ref 3.5–5.1)
Sodium: 137 mmol/L (ref 135–145)

## 2017-09-06 LAB — I-STAT ARTERIAL BLOOD GAS, ED
Acid-base deficit: 4 mmol/L — ABNORMAL HIGH (ref 0.0–2.0)
Bicarbonate: 22.4 mmol/L (ref 20.0–28.0)
O2 SAT: 95 %
PCO2 ART: 44.3 mmHg (ref 32.0–48.0)
PH ART: 7.312 — AB (ref 7.350–7.450)
TCO2: 24 mmol/L (ref 22–32)
pO2, Arterial: 80 mmHg — ABNORMAL LOW (ref 83.0–108.0)

## 2017-09-06 LAB — URINALYSIS, ROUTINE W REFLEX MICROSCOPIC
BACTERIA UA: NONE SEEN
Bilirubin Urine: NEGATIVE
Glucose, UA: NEGATIVE mg/dL
Ketones, ur: NEGATIVE mg/dL
Leukocytes, UA: NEGATIVE
Nitrite: NEGATIVE
PROTEIN: NEGATIVE mg/dL
SPECIFIC GRAVITY, URINE: 1.029 (ref 1.005–1.030)
SQUAMOUS EPITHELIAL / LPF: NONE SEEN
pH: 5 (ref 5.0–8.0)

## 2017-09-06 LAB — CBC
HCT: 41.2 % (ref 39.0–52.0)
HEMOGLOBIN: 13.3 g/dL (ref 13.0–17.0)
MCH: 27.2 pg (ref 26.0–34.0)
MCHC: 32.3 g/dL (ref 30.0–36.0)
MCV: 84.3 fL (ref 78.0–100.0)
Platelets: 213 10*3/uL (ref 150–400)
RBC: 4.89 MIL/uL (ref 4.22–5.81)
RDW: 15.6 % — ABNORMAL HIGH (ref 11.5–15.5)
WBC: 12.4 10*3/uL — ABNORMAL HIGH (ref 4.0–10.5)

## 2017-09-06 LAB — PROCALCITONIN: PROCALCITONIN: 5.95 ng/mL

## 2017-09-06 LAB — MRSA PCR SCREENING: MRSA BY PCR: NEGATIVE

## 2017-09-06 LAB — MAGNESIUM: Magnesium: 1.7 mg/dL (ref 1.7–2.4)

## 2017-09-06 LAB — ECHOCARDIOGRAM COMPLETE
HEIGHTINCHES: 68 in
Weight: 2687.85 oz

## 2017-09-06 LAB — PHOSPHORUS: Phosphorus: 3.2 mg/dL (ref 2.5–4.6)

## 2017-09-06 MED ORDER — VITAL AF 1.2 CAL PO LIQD
1000.0000 mL | ORAL | Status: DC
  Administered 2017-09-06 – 2017-09-07 (×2): 1000 mL

## 2017-09-06 MED ORDER — MAGNESIUM SULFATE IN D5W 1-5 GM/100ML-% IV SOLN
1.0000 g | Freq: Once | INTRAVENOUS | Status: AC
  Administered 2017-09-06: 1 g via INTRAVENOUS
  Filled 2017-09-06: qty 100

## 2017-09-06 MED ORDER — SODIUM CHLORIDE 0.9 % IV BOLUS (SEPSIS)
1000.0000 mL | Freq: Once | INTRAVENOUS | Status: AC
  Administered 2017-09-06: 1000 mL via INTRAVENOUS

## 2017-09-06 MED ORDER — ACETAMINOPHEN 325 MG PO TABS: 650.0000 mg | ORAL_TABLET | Freq: Four times a day (QID) | ORAL | Status: DC | PRN

## 2017-09-06 MED ORDER — PIPERACILLIN-TAZOBACTAM 3.375 G IVPB
3.3750 g | Freq: Three times a day (TID) | INTRAVENOUS | Status: DC
  Administered 2017-09-06 – 2017-09-09 (×11): 3.375 g via INTRAVENOUS
  Filled 2017-09-06 (×12): qty 50

## 2017-09-06 MED ORDER — ACETAMINOPHEN 160 MG/5ML PO SOLN
650.0000 mg | Freq: Four times a day (QID) | ORAL | Status: DC | PRN
  Administered 2017-09-06 (×2): 650 mg
  Filled 2017-09-06 (×3): qty 20.3

## 2017-09-06 MED ORDER — SODIUM CHLORIDE 0.9 % IV BOLUS (SEPSIS)
500.0000 mL | Freq: Once | INTRAVENOUS | Status: AC
  Administered 2017-09-06: 500 mL via INTRAVENOUS

## 2017-09-06 MED ORDER — CHLORHEXIDINE GLUCONATE 0.12% ORAL RINSE (MEDLINE KIT)
15.0000 mL | Freq: Two times a day (BID) | OROMUCOSAL | Status: DC
  Administered 2017-09-06 – 2017-09-09 (×8): 15 mL via OROMUCOSAL

## 2017-09-06 MED ORDER — POTASSIUM CHLORIDE 10 MEQ/100ML IV SOLN
10.0000 meq | INTRAVENOUS | Status: AC
  Administered 2017-09-06 (×4): 10 meq via INTRAVENOUS
  Filled 2017-09-06 (×4): qty 100

## 2017-09-06 MED ORDER — SODIUM CHLORIDE 0.9 % IV BOLUS (SEPSIS)
250.0000 mL | Freq: Once | INTRAVENOUS | Status: AC
  Administered 2017-09-06: 250 mL via INTRAVENOUS

## 2017-09-06 MED ORDER — VANCOMYCIN HCL 10 G IV SOLR
1500.0000 mg | Freq: Once | INTRAVENOUS | Status: AC
  Administered 2017-09-06: 1500 mg via INTRAVENOUS
  Filled 2017-09-06: qty 1500

## 2017-09-06 MED ORDER — VANCOMYCIN HCL IN DEXTROSE 1-5 GM/200ML-% IV SOLN
1000.0000 mg | INTRAVENOUS | Status: DC
  Administered 2017-09-07 – 2017-09-09 (×3): 1000 mg via INTRAVENOUS
  Filled 2017-09-06 (×4): qty 200

## 2017-09-06 MED ORDER — SODIUM CHLORIDE 0.9 % IV SOLN
INTRAVENOUS | Status: AC
  Administered 2017-09-06: 09:00:00 via INTRAVENOUS

## 2017-09-06 MED ORDER — PIPERACILLIN-TAZOBACTAM 3.375 G IVPB 30 MIN
3.3750 g | Freq: Once | INTRAVENOUS | Status: AC
  Administered 2017-09-06: 3.375 g via INTRAVENOUS
  Filled 2017-09-06: qty 50

## 2017-09-06 MED ORDER — ORAL CARE MOUTH RINSE
15.0000 mL | Freq: Four times a day (QID) | OROMUCOSAL | Status: DC
  Administered 2017-09-06 – 2017-09-09 (×15): 15 mL via OROMUCOSAL

## 2017-09-06 MED ORDER — PRO-STAT SUGAR FREE PO LIQD: 30.0000 mL | Freq: Two times a day (BID) | ORAL | Status: DC

## 2017-09-06 NOTE — Progress Notes (Signed)
  Echocardiogram 2D Echocardiogram has been performed.  Delcie RochENNINGTON, Basheer Molchan 09/06/2017, 12:26 PM

## 2017-09-06 NOTE — Progress Notes (Signed)
CRITICAL VALUE ALERT  Critical Value:  Lactic Acid 5.9  Date & Time Notied:  09/06/16 0810  Provider Notified: Dr. Loney Lohathore  Orders Received/Actions taken: 500 ml Sodium Chloride Bolus. Repeat to be drawn around 0900.

## 2017-09-06 NOTE — Progress Notes (Signed)
Critical results were reported to RN

## 2017-09-06 NOTE — Progress Notes (Signed)
PULMONARY / CRITICAL CARE MEDICINE   Name: Douglas Arnold MRN: [id number] DOB: 1927/06/27    ADMISSION DATE:  2017-09-07 CONSULTATION DATE:  Sep 07, 2017  REFERRING MD:  Hyacinth Meeker  CHIEF COMPLAINT:  AMS  BRIEF SUMMARY:  Pt is encephelopathic; therefore, this HPI is obtained from chart review. Douglas Arnold is a 84 y.o. male with PMH of COPD, current tobacco dependence, chronic pain with opiate use, b/l hearing loss, confusion, impaired ambulation with multiple recent falls for which was admitted to SNF in the past 2 weeks (just discharged from [facility] on 08/17/17).  On 1/24, he had another fall followed by confusion and slurred speech.  He was brought to ED where CT of the head demonstrated SAH and SDH. Following CT, he had a seizure; therefore, he was intubated for airway protection. Neurosurgery was consulted and do not consider him to be a surgical candidate.  They feel that prognosis is poor and are recommending comfort care.  PAST MEDICAL HISTORY :  He  has a past medical history of Chronic pain, COPD (chronic obstructive pulmonary disease) (HCC), HTN (hypertension), Squamous cell carcinoma in situ (SCCIS) of skin, and Tobacco abuse.  PAST SURGICAL HISTORY: He  has no past surgical history on file.  Not on File  No current facility-administered medications on file prior to encounter.    No current outpatient medications on file prior to encounter.    FAMILY HISTORY:  His has no family status information on file.    SOCIAL HISTORY: He  reports that he has been smoking cigarettes.  He has a 70.00 pack-year smoking history. he has never used smokeless tobacco.  REVIEW OF SYSTEMS:  Unable to obtain as pt is encephalopathic.  SUBJECTIVE:  On vent, unresponsive.   VITAL SIGNS: BP (!) 86/64   Pulse (!) 112   Temp (!) 101.8 F (38.8 C)   Resp (!) 31   Ht 5\' 8"  (1.727 m)   Wt 167 lb 15.9 oz (76.2 kg)   SpO2 100%   BMI 25.54 kg/m   HEMODYNAMICS:    VENTILATOR SETTINGS: Vent Mode:  PRVC FiO2 (%):  [40 %-100 %] 40 % Set Rate:  [16 bmp-20 bmp] 20 bmp Vt Set:  [500 mL] 500 mL PEEP:  [5 cmH20] 5 cmH20 Plateau Pressure:  [17 cmH20-19 cmH20] 19 cmH20  INTAKE / OUTPUT: I/O last 3 completed shifts: In: 971.3 [I.V.:121.3; IV Piggyback:850] Out: 1475 [Urine:1475]   PHYSICAL EXAMINATION:  General: Elderly male, critically ill. Neuro:  Unresponsive. HEENT: Left eye with surround hematoma. Cardiovascular: S1 and S2 appreciated, no M/R/G.  Lungs: Respirations even and unlabored.  CTA bilaterally.  Abdomen: BS x 4, soft, NT/ND.  Musculoskeletal: BLE venous stasis changes, 1+ edema.  Skin: Intact, warm, no rashes.  LABS:  BMET Recent Labs  Lab 2017/09/07 1900 09/07/2017 1915 09/06/17 0222  NA 138 141 137  K 3.7 3.8 3.6  CL 105 105 105  CO2 24  --  20*  BUN 15 14 12   CREATININE 0.69 0.60* 0.68  GLUCOSE 146* 149* 131*    Electrolytes Recent Labs  Lab September 07, 2017 1900 09/06/17 0222  CALCIUM 8.6* 8.5*  MG  --  1.7  PHOS  --  3.2    CBC Recent Labs  Lab 09/07/17 1900 07-Sep-2017 1915 09/06/17 0222  WBC 9.0  --  12.4*  HGB 12.2* 12.2* 13.3  HCT 37.6* 36.0* 41.2  PLT 190  --  213    Coag's Recent Labs  Lab Sep 07, 2017 1900  APTT 27  INR  1.01    Sepsis Markers No results for input(s): LATICACIDVEN, PROCALCITON, O2SATVEN in the last 168 hours.  ABG Recent Labs  Lab 2018-02-05 2207 09/06/17 0040  PHART 7.222* 7.312*  PCO2ART 67.0* 44.3  PO2ART 287.0* 80.0*    Liver Enzymes Recent Labs  Lab 2018-02-05 1900  AST 35  ALT 20  ALKPHOS 135*  BILITOT 0.7  ALBUMIN 3.1*    Cardiac Enzymes No results for input(s): TROPONINI, PROBNP in the last 168 hours.  Glucose Recent Labs  Lab 2018-02-05 1938 09/06/17 0126  GLUCAP 160* 162*    Imaging Ct Angio Head W Or Wo Contrast  Result Date: 03-11-18 CLINICAL DATA:  Intracranial hemorrhage EXAM: CT ANGIOGRAPHY HEAD TECHNIQUE: Multidetector CT imaging of the head was performed using the standard  protocol during bolus administration of intravenous contrast. Multiplanar CT image reconstructions and MIPs were obtained to evaluate the vascular anatomy. CONTRAST:  50mL ISOVUE-370 IOPAMIDOL (ISOVUE-370) INJECTION 76% COMPARISON:  Head CT 007-30-19 FINDINGS: CTA HEAD Brain: There has developed an intraparenchymal hematoma centered in the right frontal lobe measuring 3.8 x 1.8 cm. There are multiple smaller foci of intraparenchymal blood in the right frontal lobe more laterally. There is leftward bulging of the falx cerebri, measuring 5 mm. Small intraparenchymal hemorrhage in the right temporal lobe. Anterior circulation: --Intracranial internal carotid arteries: Atherosclerotic calcification of both internal carotid arteries at the skull base. --Anterior cerebral arteries: Normal. --Middle cerebral arteries: Normal. --Posterior communicating arteries: Absent bilaterally. Posterior circulation: --Posterior cerebral arteries: Normal. --Superior cerebellar arteries: Normal. --Basilar artery: Normal. --Anterior inferior cerebellar arteries: Normal. --Posterior inferior cerebellar arteries: Normal. Vertebral arteries: Right-dominant. Mild-to-moderate atherosclerotic narrowing of the left vertebral artery. Venous sinuses: As permitted by contrast timing, patent. Anatomic variants: None Delayed phase: No abnormal intracranial enhancement. IMPRESSION: 1. Interval blooming of hemorrhagic contusion in the right frontal lobe and right temporal lobe with the largest area of intraparenchymal hematoma measuring up to 3.8 cm and located in the right frontal lobe. There is leftward bulging of the cerebral falx, measuring 5 mm right to left. 2. No aneurysm. The subarachnoid hemorrhage is therefore most likely posttraumatic. 3. No emergent large vessel occlusion or high-grade intracranial stenosis. Bilateral internal carotid artery atherosclerotic calcification without flow limiting stenosis. Critical Value/emergent results were  called by telephone at the time of interpretation on 03-11-18 at 10:16 pm to Dr. Eber HongBRIAN MILLER , who verbally acknowledged these results. Electronically Signed   By: Deatra RobinsonKevin  Herman M.D.   On: 007-30-19 22:19   Ct Cervical Spine Wo Contrast  Result Date: 03-11-18 CLINICAL DATA:  Code stroke. EXAM: CT HEAD WITHOUT CONTRAST CT CERVICAL SPINE WITHOUT CONTRAST TECHNIQUE: Contiguous axial images were obtained from the base of the skull through the vertex without intravenous contrast. Continuous axial images of the cervical spine without contrast. COMPARISON:  None. FINDINGS: CT HEAD FINDINGS Brain: Large amount of subarachnoid hemorrhage, predominantly within the left sylvian fissure and basal cisterns. There is also subarachnoid blood over the right convexity and within the right sylvian fissure and overlying both frontal lobes. There is mild volume loss with confluent periventricular hypoattenuation. No mass effect or hydrocephalus. No intraventricular extension of hemorrhage. Vascular: The distal basilar artery is hyperdense, but is difficult to separate from adjacent hemorrhage on some of the images. No advanced atherosclerotic calcification of the arteries at the skull base. Skull: Posterior left parietal scalp hematoma.  No skull fracture. Sinuses/Orbits: No sinus fluid levels or advanced mucosal thickening. No mastoid effusion. Normal orbits. CERVICAL SPINE FINDINGS Alignment: No static subluxation.  Facets are aligned. Occipital condyles and the lateral masses of C1 and C2 are normally approximated. Skull base and vertebrae: Intermediate height loss of the C7 vertebra without adjacent soft tissue abnormality. No other evidence of fracture. Soft tissues and spinal canal: No prevertebral fluid or swelling. No visible canal hematoma. Disc levels: There is multilevel facet hypertrophy, worst at right C2-3 and C3-4. No bony spinal canal stenosis. Upper chest: No pneumothorax, pulmonary nodule or pleural effusion.  Other: Normal visualized paraspinal cervical soft tissues. IMPRESSION: 1. Large volume subarachnoid hemorrhage, predominantly located in the anterior basal cisterns and left sylvian fissure, but also layering over both frontal lobes in the lateral right convexity, extending into the right sylvian fissure. In the context of fall with head trauma, it is possible that this is a posttraumatic subarachnoid hemorrhage; however, the amount of blood in the basal cisterns and sylvian fissures with the hyperdense appearance of the distal basilar artery is concerning for ruptured aneurysm. CTA is recommended. 2. No significant mass effect or hydrocephalus. No intraventricular extension of blood. 3. Compression deformity of the C7 vertebra is suspected to be chronic. No acute fracture or static subluxation of the cervical spine. Critical Value/emergent results were called by telephone at the time of interpretation on 09/07/2017 at 7:23 pm to Dr. Wilford Corner, who verbally acknowledged these results. Electronically Signed   By: Deatra Robinson M.D.   On: 08/13/2017 19:24   Dg Chest Portable 1 View  Result Date: 08/26/2017 CLINICAL DATA:  Status post intubation EXAM: PORTABLE CHEST 1 VIEW COMPARISON:  08/30/2017 FINDINGS: Cardiac shadow is stable. Endotracheal tube and nasogastric catheter are seen. The nasogastric catheter extends beneath the edge of the film. The endotracheal tube is noted at the level of the carina and should be withdrawn 1-2 cm. Lungs are well aerated bilaterally. Mild interstitial changes are again seen and stable. No bony abnormality is noted. IMPRESSION: Stable interstitial changes. Endotracheal tube at the level of the carina. This should be withdrawn 1-2 cm. Electronically Signed   By: Alcide Clever M.D.   On: 09/12/2017 21:00   Dg Chest Port 1 View  Result Date: 09/02/2017 CLINICAL DATA:  Cough and shortness of Breath EXAM: PORTABLE CHEST 1 VIEW COMPARISON:  None. FINDINGS: Cardiac shadow is within normal  limits. Mild interstitial changes are seen without focal infiltrate. Pleuroparenchymal scarring is noted in the apices bilaterally. No acute bony abnormality is seen. IMPRESSION: No acute abnormality noted. Electronically Signed   By: Alcide Clever M.D.   On: 08/18/2017 19:53   Ct Head Code Stroke Wo Contrast  Result Date: 09/02/2017 CLINICAL DATA:  Code stroke. EXAM: CT HEAD WITHOUT CONTRAST CT CERVICAL SPINE WITHOUT CONTRAST TECHNIQUE: Contiguous axial images were obtained from the base of the skull through the vertex without intravenous contrast. Continuous axial images of the cervical spine without contrast. COMPARISON:  None. FINDINGS: CT HEAD FINDINGS Brain: Large amount of subarachnoid hemorrhage, predominantly within the left sylvian fissure and basal cisterns. There is also subarachnoid blood over the right convexity and within the right sylvian fissure and overlying both frontal lobes. There is mild volume loss with confluent periventricular hypoattenuation. No mass effect or hydrocephalus. No intraventricular extension of hemorrhage. Vascular: The distal basilar artery is hyperdense, but is difficult to separate from adjacent hemorrhage on some of the images. No advanced atherosclerotic calcification of the arteries at the skull base. Skull: Posterior left parietal scalp hematoma.  No skull fracture. Sinuses/Orbits: No sinus fluid levels or advanced mucosal thickening. No mastoid effusion. Normal  orbits. CERVICAL SPINE FINDINGS Alignment: No static subluxation. Facets are aligned. Occipital condyles and the lateral masses of C1 and C2 are normally approximated. Skull base and vertebrae: Intermediate height loss of the C7 vertebra without adjacent soft tissue abnormality. No other evidence of fracture. Soft tissues and spinal canal: No prevertebral fluid or swelling. No visible canal hematoma. Disc levels: There is multilevel facet hypertrophy, worst at right C2-3 and C3-4. No bony spinal canal  stenosis. Upper chest: No pneumothorax, pulmonary nodule or pleural effusion. Other: Normal visualized paraspinal cervical soft tissues. IMPRESSION: 1. Large volume subarachnoid hemorrhage, predominantly located in the anterior basal cisterns and left sylvian fissure, but also layering over both frontal lobes in the lateral right convexity, extending into the right sylvian fissure. In the context of fall with head trauma, it is possible that this is a posttraumatic subarachnoid hemorrhage; however, the amount of blood in the basal cisterns and sylvian fissures with the hyperdense appearance of the distal basilar artery is concerning for ruptured aneurysm. CTA is recommended. 2. No significant mass effect or hydrocephalus. No intraventricular extension of blood. 3. Compression deformity of the C7 vertebra is suspected to be chronic. No acute fracture or static subluxation of the cervical spine. Critical Value/emergent results were called by telephone at the time of interpretation on 09/02/2017 at 7:23 pm to Dr. Wilford Corner, who verbally acknowledged these results. Electronically Signed   By: Deatra Robinson M.D.   On: 09/08/2017 19:24   STUDIES:  See above   CULTURES: Resp 1/25 > abundant gram positive cocci and gram negative rods, moderate yeast   ANTIBIOTICS: Vanc 1/25 > Zosyn 1/25 >  SIGNIFICANT EVENTS: 1/24 > admitted.  LINES/TUBES: ETT 1/24 >   DISCUSSION: 84 y.o. male with recent falls who was admitted to SNF 2 weeks ago, admitted 1/24 with [facility] after another fall.  Had seizure in ED and subsequently intubated.  Neurosurgery consulted who feels that he has poor prognosis; therefore, recommending comfort care.  ASSESSMENT / PLAN:  PULMONARY A: Respiratory insufficiency - due to inability to protect the airway in the setting of SAH, SDH, and seizures. Hx COPD with current tobacco dependence. P:   Full vent support. Wean as able. VAP prevention measures. Albuterol PRN. AM CXR  pending  CARDIOVASCULAR A:  A.Fib with RVR Hypotension - likely in the setting of sepsis. P:  D/c Lopressor PRN. No anticoagulation due to [facility] May need Levophed if he does not respond to fluid resuscitation per sepsis protocol   RENAL A:   No acute issues. P:   NS @ 75. BMP daily to monitor electrolytes   GASTROINTESTINAL A:   GI prophylaxis. Nutrition. P:  SUP: Pantoprazole. NPO.  HEMATOLOGIC A:   VTE Prophylaxis. P:  SCD's only. Hgb stable at 13.3 this morning  Daily CBC  INFECTIOUS A:   Sepsis - Mild leukocytosis (12.4) and T max 101.8. Respiratory culture growing abundant gram positive cocci and gram negative rods, moderate yeast. Lactate 5.9>6.3. PCT 5.9.  P:   Continue empiric Vanc and Zosyn Start fluconazole Fluid resuscitation    ENDOCRINE A: No acute issues. P: No interventions required.  NEUROLOGIC A:   SAH and SDH - likely post traumatic Seizure - presumed due to above. Hx frequent falls (had recent admission to SNF after discharge from [facility] 08/17/17), confusion, chronic pain. P:   Sedation:  D/c Propofol gtt due to hypotension. Fentanyl PRN sedation.  RASS goal: 0 to -1. Daily WUA. Continue keppra with PRN ativan. Neurosurgery following, do not  feel that pt is a candidate for surgical intervention. Poor prognosis given by neurosurgery, recommending comfort care.  Family updated: Per prior documentation, daughter in law was updated by Dr. Hyacinth Meeker in ED.  She stated pt just recently wished for FULL CODE.  She would like to continue with full code at this time.  She is on her way to the hospital and will likely need further discussions regarding goals of care.  Interdisciplinary Family Meeting v Palliative Care Meeting:  Due by: 09/11/17.  CC time: 30 min.  Rutherford Guys, [place] [facility] Pulmonary & Critical Care Medicine Pager: [phone]  or [phone] 09/06/2017, 7:16 AM  Attending Note:  84 year old male s/p CVA that was  intubated overnight for hypotension and concern for aspiration.  Patient has been hypotensive overnight with clear lungs on exam.  I reviewed CXR myself, mild edema noted.  Discussed with PCCM-NP.  NS saw patient is he is not an operative candidate.  Awaiting arrival of daughter for further discussion.  Patient is a full code right now.  Will start TF.  Address electrolytes.  Continue abx for now for concern for HCAP.  F/U on culture.  Will recommend a palliative approach.  D/C all sedation.  Hold off pressors for now.  The patient is critically ill with multiple organ systems failure and requires high complexity decision making for assessment and support, frequent evaluation and titration of therapies, application of advanced monitoring technologies and extensive interpretation of multiple databases.   Critical Care Time devoted to patient care services described in this note is  35  Minutes. This time reflects time of care of this signee Dr Koren Bound. This critical care time does not reflect procedure time, or teaching time or supervisory time of PA/NP/Med student/Med Resident etc but could involve care discussion time.  Alyson Reedy, M.D. [facility] Pulmonary/Critical Care Medicine. Pager: [phone]. After hours pager: [phone].

## 2017-09-06 NOTE — Progress Notes (Signed)
CRITICAL VALUE ALERT  Critical Value:  Lactic acid: 6.3  Date & Time Notied:  09/06/17 at 1016  Provider Notified: Dr. Loney Lohathore  Orders Received/Actions taken: No new orders at this time. Provider states she will come to assess patient shortly.

## 2017-09-06 NOTE — Progress Notes (Signed)
CRITICAL VALUE ALERT  Critical Value:  Lactic Acid: 5.7  Date & Time Notied:  09/06/17 1444  Provider Notified: Dr. Loney Lohathore  Orders Received/Actions taken: No new orders at this time.

## 2017-09-06 NOTE — Progress Notes (Signed)
Initial Nutrition Assessment  DOCUMENTATION CODES:   Not applicable  INTERVENTION:    Vital AF 1.2 at 20 ml/h, increase by 10 ml every 8 hours to goal rate of 60 ml/h (1440 ml per day)  Provides 1728 kcal, 108 gm protein, 1168 ml free water daily  NUTRITION DIAGNOSIS:   Inadequate oral intake related to inability to eat as evidenced by NPO status.  GOAL:   Patient will meet greater than or equal to 90% of their needs  MONITOR:   Vent status, TF tolerance, I & O's  REASON FOR ASSESSMENT:   Ventilator, Consult Enteral/tube feeding initiation and management  ASSESSMENT:   84 yo male with PMH of COPD, HTN, tobacco abuse, squamous cell carcinoma of the skin who was admitted on 1/24 with AMS, required intubation.  Discussed patient in ICU rounds and with RN today. Received MD Consult for TF initiation and management. Patient is currently intubated on ventilator support MV: 10.9 L/min Temp (24hrs), Avg:100.1 F (37.8 C), Min:97.9 F (36.6 C), Max:101.8 F (38.8 C)  Propofol: off  Labs and medications reviewed. CBG: 162 Lactic acid 5.9 --> 6.3 --> 5.7   NUTRITION - FOCUSED PHYSICAL EXAM:  unable to complete at this time  Diet Order:  Diet NPO time specified  EDUCATION NEEDS:   No education needs have been identified at this time  Skin:  Skin Assessment: Reviewed RN Assessment  Last BM:  1/25  Height:   Ht Readings from Last 1 Encounters:  09/06/17 5\' 8"  (1.727 m)    Weight:   Wt Readings from Last 1 Encounters:  09/06/17 167 lb 15.9 oz (76.2 kg)    Ideal Body Weight:  70 kg  BMI:  Body mass index is 25.54 kg/m.  Estimated Nutritional Needs:   Kcal:  1795  Protein:  105-120 gm  Fluid:  1.8-2 L   Joaquin CourtsKimberly Hosie Sharman, RD, LDN, CNSC Pager [phone] After Hours Pager [phone]

## 2017-09-06 NOTE — Progress Notes (Signed)
Hypokalemia and Hypomagnesemia >> will replete both. New fever. UA already sent is negative. Sputum culture pending. Will obtain blood cultures. Check procal and lactate. Start Vanc and Zosyn.

## 2017-09-06 NOTE — Progress Notes (Signed)
Pharmacy Antibiotic Note  Douglas Arnold is a 84 y.o. male admitted on 09/04/2017 with SAH/SDH w/ concern for sepsis.  Pharmacy has been consulted for Vancocin and Zosyn dosing.  Plan: Vancomycin 1500mg  x1 then 1000mg  IV every 24 hours.  Goal trough 15-20 mcg/mL. Zosyn 3.375g IV q8h (4 hour infusion).  Height: 5\' 8"  (172.7 cm) Weight: 167 lb 15.9 oz (76.2 kg) IBW/kg (Calculated) : 68.4  Temp (24hrs), Avg:100.7 F (38.2 C), Min:97.9 F (36.6 C), Max:101.7 F (38.7 C)  Recent Labs  Lab 09/04/2017 1900 09/01/2017 1915 09/06/17 0222  WBC 9.0  --  12.4*  CREATININE 0.69 0.60* 0.68    Estimated Creatinine Clearance: 61.8 mL/min (by C-G formula based on SCr of 0.68 mg/dL).     Thank you for allowing pharmacy to be a part of this patient's care.  Vernard GamblesVeronda Damieon Armendariz, PharmD, BCPS  09/06/2017 5:15 AM

## 2017-09-07 ENCOUNTER — Inpatient Hospital Stay (HOSPITAL_COMMUNITY): Payer: Medicare Other

## 2017-09-07 LAB — BASIC METABOLIC PANEL
Anion gap: 11 (ref 5–15)
BUN: 18 mg/dL (ref 6–20)
CO2: 20 mmol/L — ABNORMAL LOW (ref 22–32)
CREATININE: 0.77 mg/dL (ref 0.61–1.24)
Calcium: 8.4 mg/dL — ABNORMAL LOW (ref 8.9–10.3)
Chloride: 106 mmol/L (ref 101–111)
Glucose, Bld: 115 mg/dL — ABNORMAL HIGH (ref 65–99)
Potassium: 4.3 mmol/L (ref 3.5–5.1)
SODIUM: 137 mmol/L (ref 135–145)

## 2017-09-07 LAB — GLUCOSE, CAPILLARY
GLUCOSE-CAPILLARY: 113 mg/dL — AB (ref 65–99)
GLUCOSE-CAPILLARY: 91 mg/dL (ref 65–99)
Glucose-Capillary: 101 mg/dL — ABNORMAL HIGH (ref 65–99)
Glucose-Capillary: 86 mg/dL (ref 65–99)
Glucose-Capillary: 86 mg/dL (ref 65–99)
Glucose-Capillary: 97 mg/dL (ref 65–99)

## 2017-09-07 LAB — BLOOD GAS, ARTERIAL
Acid-base deficit: 1.9 mmol/L (ref 0.0–2.0)
Bicarbonate: 22.1 mmol/L (ref 20.0–28.0)
DRAWN BY: 41977
FIO2: 40
MECHVT: 500 mL
O2 Saturation: 96.6 %
PEEP: 5 cmH2O
PO2 ART: 84.4 mmHg (ref 83.0–108.0)
Patient temperature: 98.6
RATE: 20 resp/min
pCO2 arterial: 36 mmHg (ref 32.0–48.0)
pH, Arterial: 7.405 (ref 7.350–7.450)

## 2017-09-07 LAB — CBC
HCT: 31.7 % — ABNORMAL LOW (ref 39.0–52.0)
Hemoglobin: 10.2 g/dL — ABNORMAL LOW (ref 13.0–17.0)
MCH: 27.3 pg (ref 26.0–34.0)
MCHC: 32.2 g/dL (ref 30.0–36.0)
MCV: 84.8 fL (ref 78.0–100.0)
PLATELETS: 144 10*3/uL — AB (ref 150–400)
RBC: 3.74 MIL/uL — AB (ref 4.22–5.81)
RDW: 16.3 % — ABNORMAL HIGH (ref 11.5–15.5)
WBC: 15 10*3/uL — ABNORMAL HIGH (ref 4.0–10.5)

## 2017-09-07 LAB — LACTIC ACID, PLASMA: LACTIC ACID, VENOUS: 3.9 mmol/L — AB (ref 0.5–1.9)

## 2017-09-07 LAB — PROCALCITONIN: PROCALCITONIN: 5.38 ng/mL

## 2017-09-07 LAB — MAGNESIUM: MAGNESIUM: 1.9 mg/dL (ref 1.7–2.4)

## 2017-09-07 LAB — PHOSPHORUS: PHOSPHORUS: 3.4 mg/dL (ref 2.5–4.6)

## 2017-09-07 MED ORDER — FENTANYL 2500MCG IN NS 250ML (10MCG/ML) PREMIX INFUSION
0.0000 ug/h | INTRAVENOUS | Status: DC
  Administered 2017-09-07: 25 ug/h via INTRAVENOUS
  Administered 2017-09-08: 50 ug/h via INTRAVENOUS
  Filled 2017-09-07 (×3): qty 250

## 2017-09-07 NOTE — Progress Notes (Signed)
RT note: ETT pulled back 2cm per MD order.  Patient tolerated well.  Will continue to monitor.

## 2017-09-07 NOTE — Progress Notes (Signed)
PULMONARY / CRITICAL CARE MEDICINE   Name: Douglas Arnold MRN: [id number] DOB: Oct 11, 1926    ADMISSION DATE:  08/20/2017 CONSULTATION DATE:  09/09/2017  REFERRING MD:  Hyacinth MeekerMiller  CHIEF COMPLAINT:  Confusion  HISTORY OF PRESENT ILLNESS:   84 y/o male with COPD came from and SNF with a fall followed by confusion and slurred speech.  He was found to have a intracranial hemorrhage.  He had a seizure and needed to be intubated for airway protection.    PAST MEDICAL HISTORY :  He  has a past medical history of Chronic pain, COPD (chronic obstructive pulmonary disease) (HCC), HTN (hypertension), Squamous cell carcinoma in situ (SCCIS) of skin, and Tobacco abuse.   SUBJECTIVE:  No acute events overnight Reaches for ETT  Requiring fentanyl  VITAL SIGNS: BP 108/60   Pulse 84   Temp 99.3 F (37.4 C)   Resp (!) 27   Ht 5\' 8"  (1.727 m)   Wt 171 lb 1.2 oz (77.6 kg)   SpO2 100%   BMI 26.01 kg/m   HEMODYNAMICS:    VENTILATOR SETTINGS: Vent Mode: PSV;CPAP FiO2 (%):  [40 %] 40 % Set Rate:  [20 bmp] 20 bmp Vt Set:  [500 mL] 500 mL PEEP:  [5 cmH20] 5 cmH20 Pressure Support:  [12 cmH20] 12 cmH20 Plateau Pressure:  [7 cmH20-16 cmH20] 15 cmH20  INTAKE / OUTPUT: I/O last 3 completed shifts: In: 4583.7 [I.V.:2083.7; NG/GT:490; IV Piggyback:2010] Out: 2470 [Urine:2370; Emesis/NG output:100]  PHYSICAL EXAMINATION:  General:  In bed on vent HENT: NCAT ETT in place PULM: CTA B, vent supported breathing CV: RRR, no mgr GI: BS+, soft, nontender MSK: normal bulk and tone Neuro: sedated on vent, will reach for tube with R hand  LABS:  BMET Recent Labs  Lab 08/28/2017 1900 09/12/2017 1915 09/06/17 0222 09/07/17 0319  NA 138 141 137 137  K 3.7 3.8 3.6 4.3  CL 105 105 105 106  CO2 24  --  20* 20*  BUN 15 14 12 18   CREATININE 0.69 0.60* 0.68 0.77  GLUCOSE 146* 149* 131* 115*    Electrolytes Recent Labs  Lab 09/07/2017 1900 09/06/17 0222 09/07/17 0319  CALCIUM 8.6* 8.5* 8.4*   MG  --  1.7 1.9  PHOS  --  3.2 3.4    CBC Recent Labs  Lab 09/04/2017 1900 09/09/2017 1915 09/06/17 0222 09/07/17 0319  WBC 9.0  --  12.4* 15.0*  HGB 12.2* 12.2* 13.3 10.2*  HCT 37.6* 36.0* 41.2 31.7*  PLT 190  --  213 144*    Coag's Recent Labs  Lab 08/13/2017 1900  APTT 27  INR 1.01    Sepsis Markers Recent Labs  Lab 09/06/17 0629  09/06/17 1707 09/06/17 1911 09/06/17 2314 09/07/17 0319  LATICACIDVEN  --    < > 6.3* 5.9* 3.9*  --   PROCALCITON 5.95  --   --   --   --  5.38   < > = values in this interval not displayed.    ABG Recent Labs  Lab 09/07/2017 2207 09/06/17 0040 09/07/17 0323  PHART 7.222* 7.312* 7.405  PCO2ART 67.0* 44.3 36.0  PO2ART 287.0* 80.0* 84.4    Liver Enzymes Recent Labs  Lab 08/18/2017 1900  AST 35  ALT 20  ALKPHOS 135*  BILITOT 0.7  ALBUMIN 3.1*    Cardiac Enzymes No results for input(s): TROPONINI, PROBNP in the last 168 hours.  Glucose Recent Labs  Lab 08/21/2017 1938 09/06/17 0126 09/06/17 2005 09/07/17 0044 09/07/17 0434  GLUCAP 160* 162* 90 113* 101*    Imaging Dg Chest Port 1 View  Result Date: 09/07/2017 CLINICAL DATA:  History of ETT. EXAM: PORTABLE CHEST 1 VIEW COMPARISON:  September 06, 2016 FINDINGS: The ETT terminates 15 mm above the carina. The NG tube terminates below today's film. Pleural thickening in the lateral right apex is stable. No pneumothorax. Patchy infiltrates on the left are improved. Mild increased opacity in the right base. Stable cardiomediastinal silhouette. Probable atelectasis and tiny effusion on the left. IMPRESSION: 1. The ETT terminates 1.5 cm above the carina. Recommend withdrawing 1 or 2 cm. 2. Improvement of patchy opacities in the left lung. Probable remaining tiny effusion and underlying atelectasis in the left base. 3. Mild increased opacity in the right base may represent atelectasis. Recommend attention on follow-up. These results will be called to the ordering clinician or  representative by the Radiologist Assistant, and communication documented in the PACS or zVision Dashboard. Electronically Signed   By: Gerome Sam III M.D   On: 09/07/2017 07:30     STUDIES:  1/24 CTA head > R frontal/temporal lobe intraparenchymal hematoma with R>L shift, no aneursym 1/24 CSpine CT> no fracture  CULTURES: Resp 1/25 > abundant gram positive cocci and gram negative rods, moderate yeast   ANTIBIOTICS: Vanc 1/25 > Zosyn 1/25 >  SIGNIFICANT EVENTS: 1/24 > admitted.  LINES/TUBES: ETT 1/24 >    DISCUSSION: 84 y/o male with a traumatic subarachnoid and ICH hemorrhage and aspiration pneumonia leading to respiratory failure and severe encephalopathy.  Seen by NSGY who feels that his prognosis for functional recovery is poor.  ASSESSMENT / PLAN:  PULMONARY A: Acute respiratory failure with hypoxemia HCAP COPD P:   Full mechanical vent support for now VAP prevention Daily WUA/SBT Continue brovana/pulmicort  CARDIOVASCULAR A:  Severe sepsis without shock P:  Tele Monitor hemodynamics Continue IVF  RENAL A:   No acute issues P:   Monitor BMET and UOP Replace electrolytes as needed  GASTROINTESTINAL A:   No acute issues P:   Tube feeding to continue Pantoprazole for stress ulcer prophylaxis  HEMATOLOGIC A:   Anemia without bleeding P:  Hold anticoagulants for DVT prophylaxis with intracerebral hemorrhage SCD  INFECTIOUS A:   HCAP P:   Continue vanc/zosyn for now F/u cultures  ENDOCRINE A:   No acute  issues P:   SSI  NEUROLOGIC A:   Acute traumatic intracranial hemorrhage (intraparenchymal, R frontal) P:   RASS goal: -1 Will add Fentanyl infusion today for comfort   FAMILY  - Updates: I spoke to [place], the patient's daughter in law.  She knows that the patient would not want to live in a SNF long term.  As we know he will not have a full functional recovery she favors [place] shifting our focus on full comfort measures.   They plan to withdraw care tomorrow; will write DNR order today.  - Inter-disciplinary family meet or Palliative Care meeting due by:  day 7   My cc time 31 minutes  Heber [place], MD [facility] Pager: [phone] Cell: [phone] After 3pm or if no response, call [phone]   09/07/2017, 7:53 AM

## 2017-09-07 NOTE — Progress Notes (Signed)
Skin is extremely fragile. During turn Skin tear noted (Rt hand and FA). Wounds cleansed, foam applied.

## 2017-09-08 LAB — GLUCOSE, CAPILLARY
GLUCOSE-CAPILLARY: 103 mg/dL — AB (ref 65–99)
GLUCOSE-CAPILLARY: 86 mg/dL (ref 65–99)
GLUCOSE-CAPILLARY: 72 mg/dL (ref 65–99)
Glucose-Capillary: 111 mg/dL — ABNORMAL HIGH (ref 65–99)
Glucose-Capillary: 72 mg/dL (ref 65–99)
Glucose-Capillary: 97 mg/dL (ref 65–99)

## 2017-09-08 LAB — CULTURE, RESPIRATORY W GRAM STAIN: Culture: NORMAL

## 2017-09-08 LAB — CULTURE, RESPIRATORY

## 2017-09-08 NOTE — Progress Notes (Signed)
Nursing Note: Patient contact family member (daughter-in-law) telephoned the unit to advise that the family would not be here tonight to withdraw and provide comfort care.  There plan is to be here on Monday, September 09, 2017 to proceed with extubation and comfort care.  Notified RT and Charge RN of change in plans.

## 2017-09-08 NOTE — Plan of Care (Signed)
Pt, responds to pain by withdrawal, does not follow commands.  Plan to one way extubate to comfort care. Waiting on family to be at the bedside.

## 2017-09-08 NOTE — Progress Notes (Signed)
No family present at this time yet.  Bed in low position, alarms are on, call bell in reach, VS stable, Fent at 50. No seizure noted.  Waiting on family to one way extubation (comfort care)

## 2017-09-08 NOTE — Progress Notes (Signed)
Nursing Note: 0615 patient with witnessed seizure involving his entire body with focus to head with twitching to the right side.  Ativan given per orders and increased fentanyl to 5075mcgs/hr.  Tube feeds turned off. Called family and left message.

## 2017-09-08 NOTE — Progress Notes (Signed)
PULMONARY / CRITICAL CARE MEDICINE   Name: Douglas Arnold MRN: [id number] DOB: 1927-06-28    ADMISSION DATE:  09-14-17 CONSULTATION DATE:  09/14/2017  REFERRING MD:  Hyacinth Meeker  CHIEF COMPLAINT:  Confusion  HISTORY OF PRESENT ILLNESS:   84 y/o male with COPD came from and SNF with a fall followed by confusion and slurred speech.  He was found to have a intracranial hemorrhage.  He had a seizure and needed to be intubated for airway protection.    PAST MEDICAL HISTORY :  He  has a past medical history of Chronic pain, COPD (chronic obstructive pulmonary disease) (HCC), HTN (hypertension), Squamous cell carcinoma in situ (SCCIS) of skin, and Tobacco abuse.   SUBJECTIVE:  Seizure and a few runs of Vtach this morning. Received ativan for seizure.   VITAL SIGNS: BP (!) 96/46   Pulse 84   Temp 100 F (37.8 C) (Core)   Resp (!) 21   Ht 5\' 8"  (1.727 m)   Wt 172 lb 6.4 oz (78.2 kg)   SpO2 97%   BMI 26.21 kg/m   HEMODYNAMICS:    VENTILATOR SETTINGS: Vent Mode: PRVC FiO2 (%):  [40 %] 40 % Set Rate:  [20 bmp] 20 bmp Vt Set:  [500 mL] 500 mL PEEP:  [5 cmH20] 5 cmH20 Plateau Pressure:  [15 cmH20-17 cmH20] 17 cmH20  INTAKE / OUTPUT: I/O last 3 completed shifts: In: 3795.1 [I.V.:1440.1; NG/GT:1490; IV Piggyback:865] Out: 950 [Urine:950]  PHYSICAL EXAMINATION:  General:  In bed on vent HENT: NCAT ETT in place PULM: CTA B, vent supported breathing CV: RRR, no mgr GI: BS+, soft, nontender MSK: normal bulk and tone Neuro: sedated on vent, does not withdraw to painful stimuli   LABS:  BMET Recent Labs  Lab 09-14-2017 1900 14-Sep-2017 1915 09/06/17 0222 09/07/17 0319  NA 138 141 137 137  K 3.7 3.8 3.6 4.3  CL 105 105 105 106  CO2 24  --  20* 20*  BUN 15 14 12 18   CREATININE 0.69 0.60* 0.68 0.77  GLUCOSE 146* 149* 131* 115*    Electrolytes Recent Labs  Lab 09-14-17 1900 09/06/17 0222 09/07/17 0319  CALCIUM 8.6* 8.5* 8.4*  MG  --  1.7 1.9  PHOS  --  3.2 3.4     CBC Recent Labs  Lab September 14, 2017 1900 Sep 14, 2017 1915 09/06/17 0222 09/07/17 0319  WBC 9.0  --  12.4* 15.0*  HGB 12.2* 12.2* 13.3 10.2*  HCT 37.6* 36.0* 41.2 31.7*  PLT 190  --  213 144*    Coag's Recent Labs  Lab 09/14/17 1900  APTT 27  INR 1.01    Sepsis Markers Recent Labs  Lab 09/06/17 0629  09/06/17 1707 09/06/17 1911 09/06/17 2314 09/07/17 0319  LATICACIDVEN  --    < > 6.3* 5.9* 3.9*  --   PROCALCITON 5.95  --   --   --   --  5.38   < > = values in this interval not displayed.    ABG Recent Labs  Lab 09/14/2017 2207 09/06/17 0040 09/07/17 0323  PHART 7.222* 7.312* 7.405  PCO2ART 67.0* 44.3 36.0  PO2ART 287.0* 80.0* 84.4    Liver Enzymes Recent Labs  Lab 09/14/17 1900  AST 35  ALT 20  ALKPHOS 135*  BILITOT 0.7  ALBUMIN 3.1*    Cardiac Enzymes No results for input(s): TROPONINI, PROBNP in the last 168 hours.  Glucose Recent Labs  Lab 09/07/17 0434 09/07/17 1250 09/07/17 1653 09/07/17 1937 09/07/17 2351 09/08/17 0335  GLUCAP 101* 97 86 91 86 103*    Imaging No results found.   STUDIES:  1/24 CTA head > R frontal/temporal lobe intraparenchymal hematoma with R>L shift, no aneursym 1/24 CSpine CT> no fracture  CULTURES: Resp 1/25 > abundant gram positive cocci and gram negative rods, moderate yeast  Blood x 2 > no growth x 1 day Respiratory pending  ANTIBIOTICS: Vanc 1/25 > Zosyn 1/25 >  SIGNIFICANT EVENTS: 1/24 > admitted.  LINES/TUBES: ETT 1/24 >    DISCUSSION: 84 y/o male with a traumatic subarachnoid and ICH hemorrhage and aspiration pneumonia leading to respiratory failure and severe encephalopathy.  Seen by NSGY who feels that his prognosis for functional recovery is poor.  ASSESSMENT / PLAN:  PULMONARY A: Acute respiratory failure with hypoxemia HCAP COPD P:   Full mechanical vent support for now VAP prevention Daily WUA/SBT Continue brovana/pulmicort  CARDIOVASCULAR A:  Severe sepsis without  shock P:  Tele Monitor hemodynamics Continue IVF   RENAL A:   No acute issues P:   No further lab draws to as patient is comfort care   GASTROINTESTINAL A:   No acute issues P:   Tube feedings on hold in the setting of seizure this morning  Pantoprazole for stress ulcer prophylaxis  HEMATOLOGIC A:   Anemia without bleeding P:  Hold anticoagulants for DVT prophylaxis with intracerebral hemorrhage SCD  INFECTIOUS A:   HCAP P:   Continue vanc/zosyn for now F/u cultures  ENDOCRINE A:   No acute  issues P:   SSI  NEUROLOGIC A:   Acute traumatic intracranial hemorrhage (intraparenchymal, R frontal) P:   RASS goal: -1 Fentanyl infusion for comfort   FAMILY  - Updates 1/26: Family favors us shifting our focus on full comfort measures.  They plan to withdraw care 1/27.  - Inter-disciplinary family meet or Palliative Care meeting due by:  day 7  John GiovanniVasundhra Ivi Griffith, MD PGY3 - IMTS

## 2017-09-09 DIAGNOSIS — I609 Nontraumatic subarachnoid hemorrhage, unspecified: Secondary | ICD-10-CM

## 2017-09-09 DIAGNOSIS — Z515 Encounter for palliative care: Secondary | ICD-10-CM

## 2017-09-09 DIAGNOSIS — Z7189 Other specified counseling: Secondary | ICD-10-CM

## 2017-09-09 DIAGNOSIS — J9601 Acute respiratory failure with hypoxia: Secondary | ICD-10-CM

## 2017-09-09 LAB — GLUCOSE, CAPILLARY
GLUCOSE-CAPILLARY: 80 mg/dL (ref 65–99)
GLUCOSE-CAPILLARY: 81 mg/dL (ref 65–99)
GLUCOSE-CAPILLARY: 77 mg/dL (ref 65–99)
GLUCOSE-CAPILLARY: 84 mg/dL (ref 65–99)
Glucose-Capillary: 67 mg/dL (ref 65–99)

## 2017-09-09 MED ORDER — SODIUM CHLORIDE 0.9 % IV SOLN
10.0000 mg/h | INTRAVENOUS | Status: DC
  Administered 2017-09-09 – 2017-09-10 (×2): 10 mg/h via INTRAVENOUS
  Filled 2017-09-09 (×2): qty 10

## 2017-09-09 MED ORDER — MORPHINE BOLUS VIA INFUSION
5.0000 mg | INTRAVENOUS | Status: DC | PRN
  Filled 2017-09-09: qty 20

## 2017-09-09 MED ORDER — MIDAZOLAM BOLUS VIA INFUSION (WITHDRAWAL LIFE SUSTAINING TX)
5.0000 mg | INTRAVENOUS | Status: DC | PRN
  Filled 2017-09-09: qty 20

## 2017-09-09 MED ORDER — SCOPOLAMINE 1 MG/3DAYS TD PT72
1.0000 | MEDICATED_PATCH | TRANSDERMAL | Status: DC
  Administered 2017-09-09: 1.5 mg via TRANSDERMAL
  Filled 2017-09-09: qty 1

## 2017-09-09 MED ORDER — MORPHINE SULFATE 25 MG/ML IV SOLN
10.0000 mg/h | INTRAVENOUS | Status: DC
  Administered 2017-09-09: 10 mg/h via INTRAVENOUS
  Filled 2017-09-09: qty 20

## 2017-09-09 MED ORDER — GLYCOPYRROLATE 0.2 MG/ML IJ SOLN: 0.2000 mg | INTRAMUSCULAR | Status: DC | PRN

## 2017-09-09 NOTE — Progress Notes (Signed)
PULMONARY / CRITICAL CARE MEDICINE   Name: Douglas Arnold MRN: [id number] DOB: 10-20-1926    ADMISSION DATE:  2017-09-28 CONSULTATION DATE:  2017-09-28  REFERRING MD:  Douglas Arnold  CHIEF COMPLAINT:  Confusion  HISTORY OF PRESENT ILLNESS:   84 y/o male with COPD came from and SNF with a fall followed by confusion and slurred speech.  He was found to have a intracranial hemorrhage.  He had a seizure and needed to be intubated for airway protection.    PAST MEDICAL HISTORY :  He  has a past medical history of Chronic pain, COPD (chronic obstructive pulmonary disease) (HCC), HTN (hypertension), Squamous cell carcinoma in situ (SCCIS) of skin, and Tobacco abuse.   SUBJECTIVE:  Intubated and sedated. Per further seizures per nursing staff.   VITAL SIGNS: BP (!) 128/59   Pulse 76   Temp 99.2 F (37.3 C) (Oral)   Resp 20   Ht 5\' 8"  (1.727 m)   Wt 173 lb 4.5 oz (78.6 kg)   SpO2 100%   BMI 26.35 kg/m   HEMODYNAMICS:    VENTILATOR SETTINGS: Vent Mode: PRVC FiO2 (%):  [40 %] 40 % Set Rate:  [20 bmp] 20 bmp Vt Set:  [500 mL] 500 mL PEEP:  [5 cmH20] 5 cmH20 Plateau Pressure:  [13 cmH20-17 cmH20] 13 cmH20  INTAKE / OUTPUT: I/O last 3 completed shifts: In: 1928.8 [I.V.:543.8; NG/GT:720; IV Piggyback:665] Out: 665 [Urine:665]  PHYSICAL EXAMINATION:   General:  In bed on vent HENT: NCAT ETT in place PULM: CTA B, vent supported breathing CV: RRR, no mgr GI: BS+, soft, nontender MSK: +1 pedal edema bilaterally  Neuro: sedated on vent, does not withdraw to painful stimuli. Spontaneous movements noted (R foot only).   LABS:  BMET Recent Labs  Lab 08/27/2017 1900 08/27/2017 1915 09/06/17 0222 09/07/17 0319  NA 138 141 137 137  K 3.7 3.8 3.6 4.3  CL 105 105 105 106  CO2 24  --  20* 20*  BUN 15 14 12 18   CREATININE 0.69 0.60* 0.68 0.77  GLUCOSE 146* 149* 131* 115*    Electrolytes Recent Labs  Lab 08/27/2017 1900 09/06/17 0222 09/07/17 0319  CALCIUM 8.6* 8.5* 8.4*  MG   --  1.7 1.9  PHOS  --  3.2 3.4    CBC Recent Labs  Lab 08/27/2017 1900 08/27/2017 1915 09/06/17 0222 09/07/17 0319  WBC 9.0  --  12.4* 15.0*  HGB 12.2* 12.2* 13.3 10.2*  HCT 37.6* 36.0* 41.2 31.7*  PLT 190  --  213 144*    Coag's Recent Labs  Lab 08/27/2017 1900  APTT 27  INR 1.01    Sepsis Markers Recent Labs  Lab 09/06/17 0629  09/06/17 1707 09/06/17 1911 09/06/17 2314 09/07/17 0319  LATICACIDVEN  --    < > 6.3* 5.9* 3.9*  --   PROCALCITON 5.95  --   --   --   --  5.38   < > = values in this interval not displayed.    ABG Recent Labs  Lab 08/27/2017 2207 09/06/17 0040 09/07/17 0323  PHART 7.222* 7.312* 7.405  PCO2ART 67.0* 44.3 36.0  PO2ART 287.0* 80.0* 84.4    Liver Enzymes Recent Labs  Lab 08/27/2017 1900  AST 35  ALT 20  ALKPHOS 135*  BILITOT 0.7  ALBUMIN 3.1*    Cardiac Enzymes No results for input(s): TROPONINI, PROBNP in the last 168 hours.  Glucose Recent Labs  Lab 09/08/17 1235 09/08/17 1608 09/08/17 1935 09/08/17 2343 09/09/17 0346  09/09/17 0723  GLUCAP 97 86 72 72 80 81    Imaging No results found.   STUDIES:  1/24 CTA head > R frontal/temporal lobe intraparenchymal hematoma with R>L shift, no aneursym 1/24 CSpine CT> no fracture  CULTURES: Resp 1/25 > gram stain showing abundant gram positive cocci and gram negative rods, moderate yeast. Culture consistent with normal respiratory flora.  Blood x 2 > no growth x 2 days  ANTIBIOTICS: Vanc 1/25 > Zosyn 1/25 >  SIGNIFICANT EVENTS: 1/24 > admitted.  LINES/TUBES: ETT 1/24 >    DISCUSSION: 84 y/o male with a traumatic subarachnoid and ICH hemorrhage and aspiration pneumonia leading to respiratory failure and severe encephalopathy.  Seen by NSGY who feels that his prognosis for functional recovery is poor.  ASSESSMENT / PLAN:  PULMONARY A: Acute respiratory failure with hypoxemia HCAP COPD P:   Full mechanical vent support for now VAP prevention Daily  WUA/SBT Continue brovana/pulmicort  CARDIOVASCULAR A:  Severe sepsis without shock P:  Tele Monitor hemodynamics Continue IVF   RENAL A:   No acute issues P:   No further lab draws as family is planning to proceed with extubation and comfort care 1/28.  GASTROINTESTINAL A:   No acute issues P:   Tube feedings on hold in the setting of seizure activity  Pantoprazole for stress ulcer prophylaxis  HEMATOLOGIC A:   Anemia without bleeding P:  Hold anticoagulants for DVT prophylaxis with intracerebral hemorrhage SCD  INFECTIOUS A:   HCAP P:   Continue vanc/zosyn for now F/u cultures  ENDOCRINE A:   No acute  issues P:   SSI  NEUROLOGIC A:   Seizure activity (resolved) Acute traumatic intracranial hemorrhage (intraparenchymal, R frontal) P:   Continue Keppra for seizure ppx RASS goal: -1 Fentanyl infusion for comfort   FAMILY  - Updates 1/26: Family favors [place] shifting our focus on full comfort measures. Family is planning to proceed with extubation and comfort care 1/28.  - Inter-disciplinary family meet or Palliative Care meeting due by:  day 7  Douglas Giovanni, MD PGY3 - IMTS  Attending Note:  84 year old male with multiple brain hemorrhages who was intubated for airway protection.  Communicated with family they are to arrive today for withdraw.  Patient is a full DNR at this point.  On exam, he is completely unresponsive.  Discussed with bedside RN, once family arrives will increase fentanyl and start a versed drip for patient to be extubated with no intention to reintubate.  PCCM will continue to follow.  The patient is critically ill with multiple organ systems failure and requires high complexity decision making for assessment and support, frequent evaluation and titration of therapies, application of advanced monitoring technologies and extensive interpretation of multiple databases.   Critical Care Time devoted to patient care services described  in this note is  35  Minutes. This time reflects time of care of this signee Dr Douglas Arnold. This critical care time does not reflect procedure time, or teaching time or supervisory time of PA/NP/Med student/Med Resident etc but could involve care discussion time.  Douglas Arnold, M.D. [facility] Pulmonary/Critical Care Medicine. Pager: [phone]. After hours pager: [phone].

## 2017-09-09 NOTE — Procedures (Signed)
Extubation Procedure Note  Patient Details:   Name: Douglas Arnold DOB: [birth year] MRN: [id number]   Airway Documentation:     Evaluation  O2 sats: currently acceptable Complications: No apparent complications Patient did tolerate procedure well. Bilateral Breath Sounds: Clear, Diminished   No   Patient terminally extubated  Sharene SkeansSilva, Indra Wolters C 09/09/2017, 11:19 PM

## 2017-09-09 NOTE — Progress Notes (Signed)
   09/09/17 2100  Clinical Encounter Type  Visited With Family;Patient not available  Visit Type Critical Care;Patient actively dying;Initial  Referral From Nurse  Spiritual Encounters  Spiritual Needs Grief support;Emotional  [place] met at bedside with family to offer emotional, grief and spiritual support; [place] available as requested for additional support. Gwynn Burly 9:14 PM .

## 2017-09-11 LAB — CULTURE, BLOOD (ROUTINE X 2)
Culture: NO GROWTH
Culture: NO GROWTH
Special Requests: ADEQUATE
Special Requests: ADEQUATE

## 2017-09-13 NOTE — Death Summary Note (Signed)
  Name: Douglas Arnold MRN: [id number] DOB: 19-Aug-1926 84 y.o.  Date of Admission: 08/14/2017  6:57 PM Date of Discharge: 04-30-18 Attending Physician: No att. providers found  Discharge Diagnosis: Active Problems:   Subarachnoid hemorrhage (HCC)   Cause of death: Altered mental status and inability to protect airway due to acute traumatic intracranial hemorrhages.  Time of death: 09/10/2016 at 01:39 am  Disposition and follow-up:   [name]    Hospital Course: 84 y/o male with a history of COPD, HTN presented with traumatic subarachnoid and ICH hemorrhages after a fall and aspiration pneumonia leading to respiratory failure and severe encephalopathy. He was intubated on admission for airway protection. He was seen by neurosurgery who recommended no surgical intervention for this patient with unfortunately very poor prognosis for functional recovery. He was also treated with IV antibiotics for the pneumonia. Family favored shifting our focus on full comfort measures as patient did not have a functional recovery and continued to be unresponsive on exam. He was extubated late night on 09/09/2017 per family wishes and died a few hours later.   Signed: John Giovanniathore, Kirsta Probert, MD 04-30-18, 2:23 PM

## 2017-09-13 NOTE — Progress Notes (Signed)
Nursing Note:  Family at bedside.  Patient absent heart tones and respiratory effort for greater than 2 minutes as auscultated by 2 RN's at bedside (Ashely Chestine Sporelark and Sylvan Cheeseelia Omolara Carol.)  Family advised of patient passing.  CCM Resident Dr. Frances FurbishWinfrey paged and notified of death.  TOD 0139.  CDS called and released at 0150.  Death Check List prepared.  40mls of 5mg /ml Morphine and and 45mls of 1:1 Versed wasted by 2 RN's in the sink per protocol with Vilinda BlanksBrandy Woodson.  Family left bedside and noted that they would not return at 0230.  Lines/Tubes/and Drains removed and body prepared for transfer to the morgue.

## 2017-09-13 DEATH — deceased

## 2018-11-09 IMAGING — CT CT HEAD CODE STROKE
4 of 8 series · 15 of 47 positions shown, 17 images · non-contrast
Comparison: None.

CLINICAL DATA: Code stroke.

EXAM:
CT HEAD WITHOUT CONTRAST
CT CERVICAL SPINE WITHOUT CONTRAST
TECHNIQUE: Contiguous axial images were obtained from the base of the skull
through the vertex without intravenous contrast. Continuous axial
images of the cervical spine without contrast.

[Series 4: head bone · axial · 0.44mm/px · z∈[-73,-33]mm · 3 of 82 slices shown]
[im 11/82  bone]
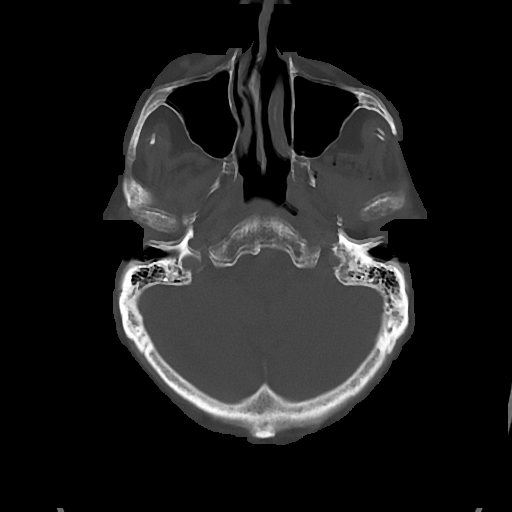
[im 21/82  bone]
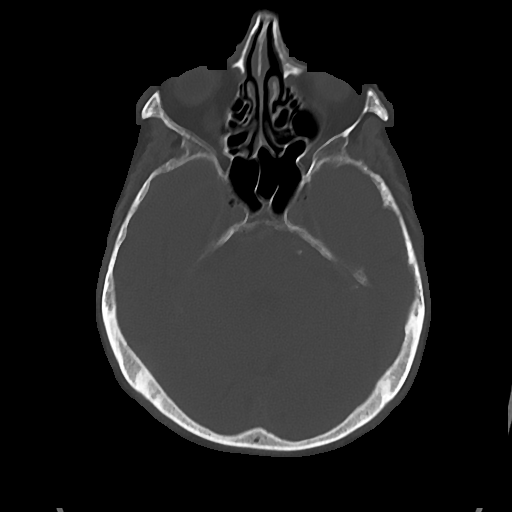
[im 31/82  bone]
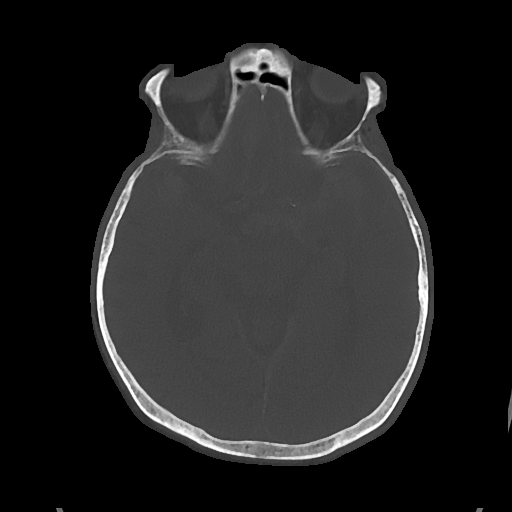

[Series 5: cor soft · coronal · 0.32mm/px · 3 of 68 slices shown]
[im 26/68  brain]
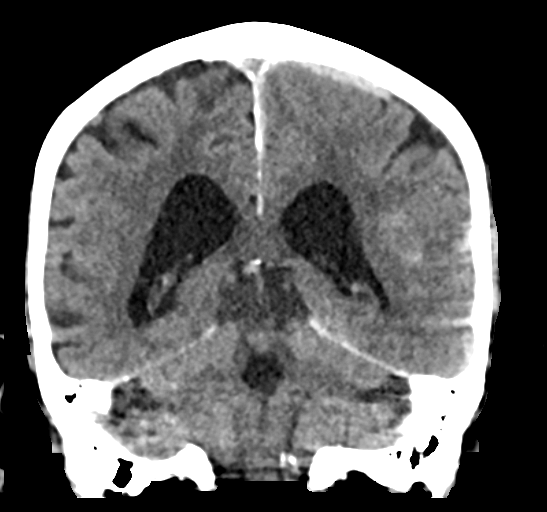
[im 34/68  brain]
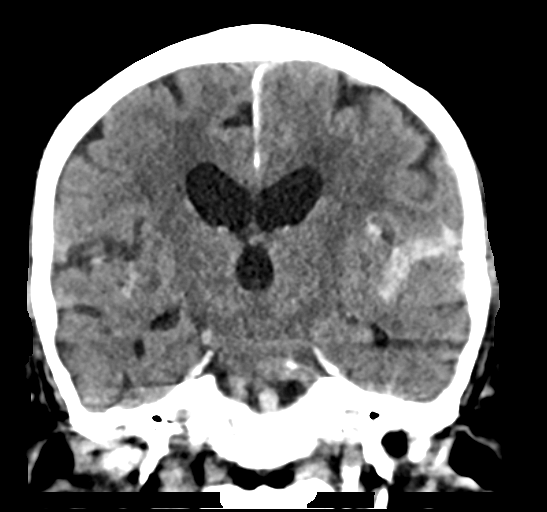
[im 42/68  brain]
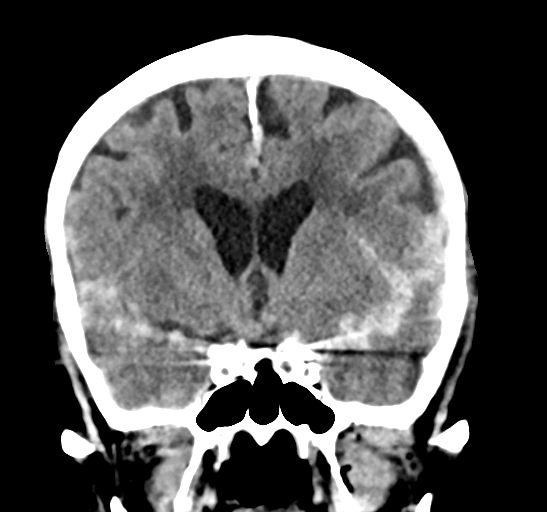

[Series 6: sag soft · sagittal · 0.32mm/px · 2 of 62 slices shown]
[im 21/62  brain]
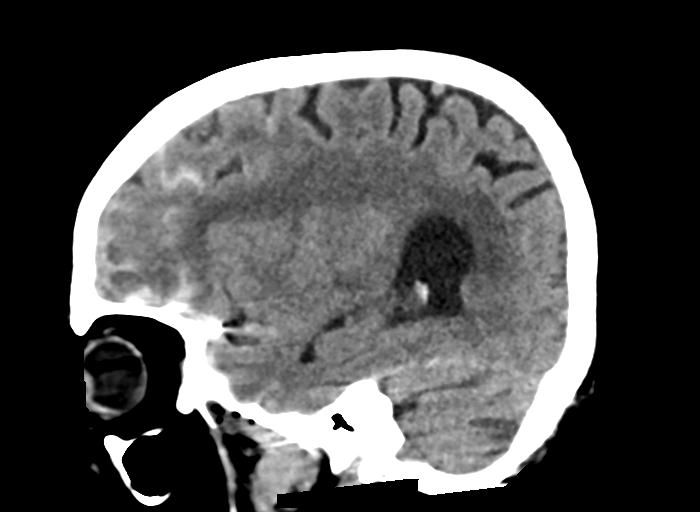
[im 41/62  brain]
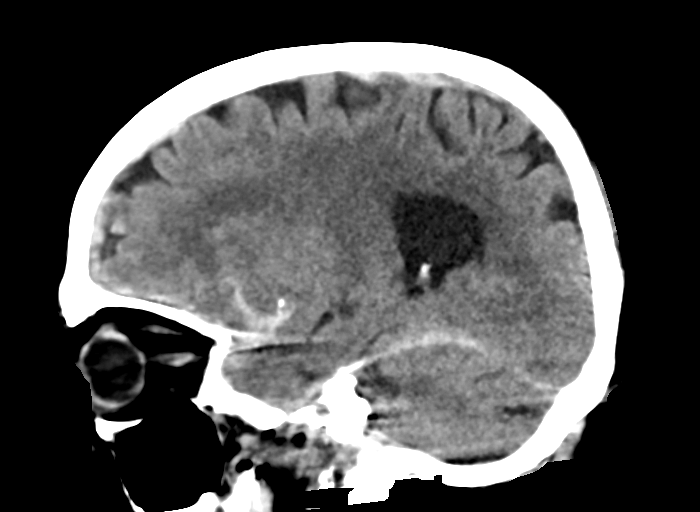

[Series 12: orthogonal axials · axial · 0.21mm/px · z∈[-227,-111]mm · 7 of 91 slices shown, 9 images]
[im 12/91  brain]
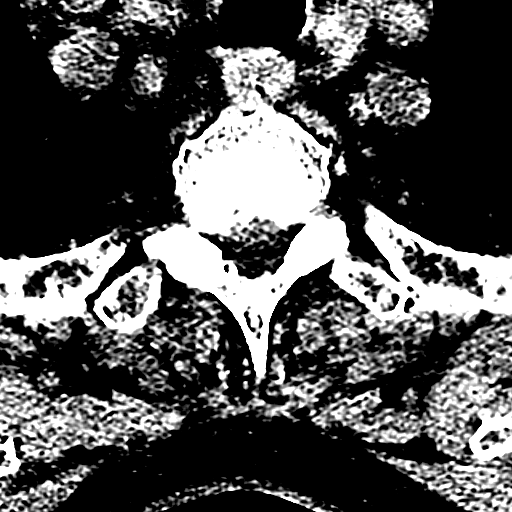
[im 12/91  bone]
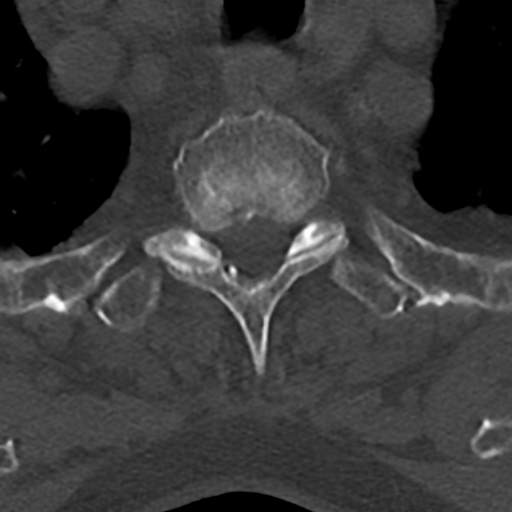
[im 23/91  brain]
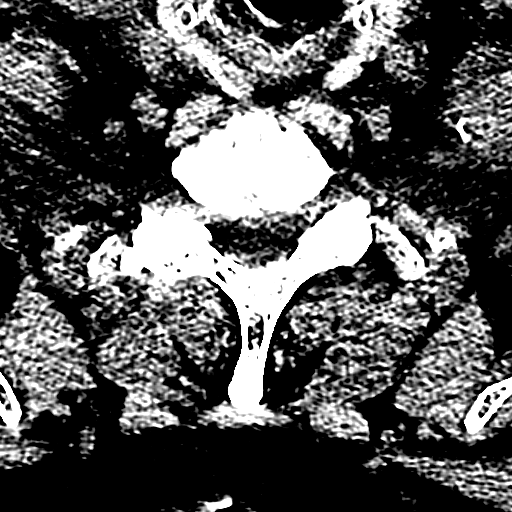
[im 34/91  brain]
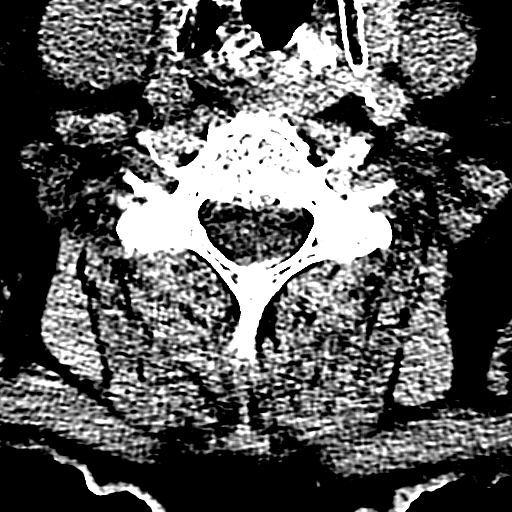
[im 46/91  brain]
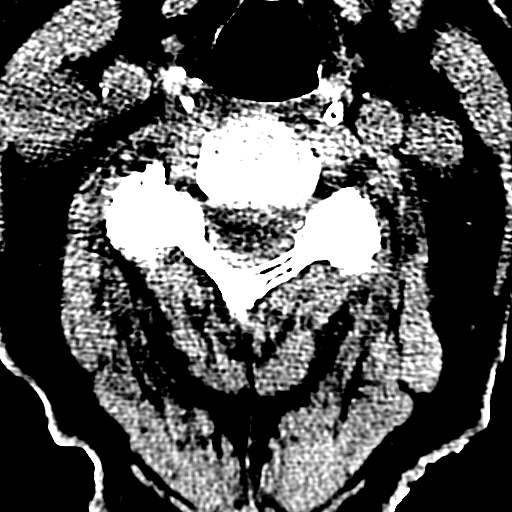
[im 57/91  brain]
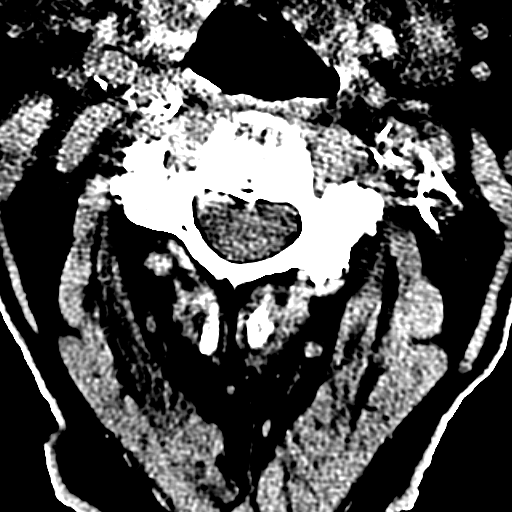
[im 57/91  bone]
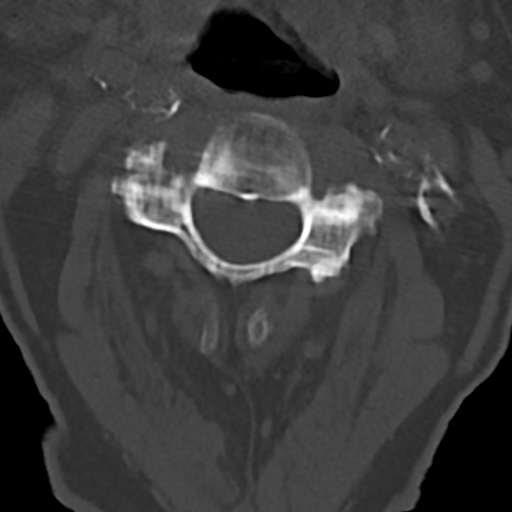
[im 68/91  brain]
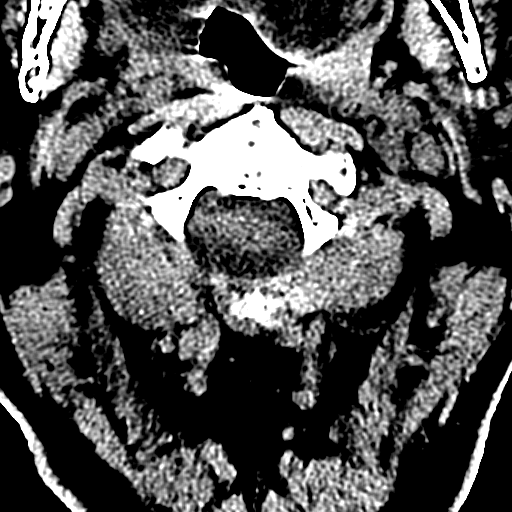
[im 79/91  brain]
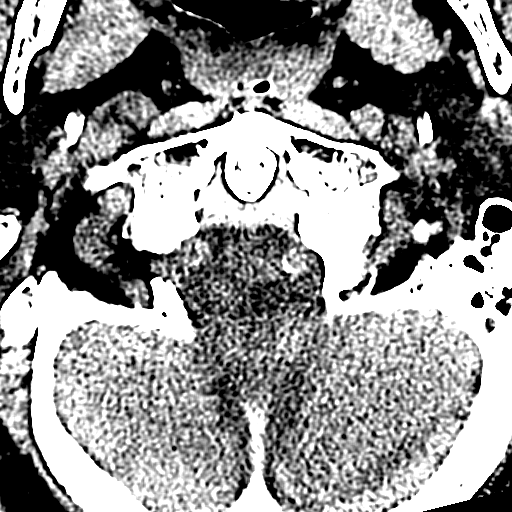

[15 of 47 positions shown; findings below may reference images not displayed]

FINDINGS: CT HEAD FINDINGS

Brain: Large amount of subarachnoid hemorrhage, predominantly within
the left sylvian fissure and basal cisterns. There is also
subarachnoid blood over the right convexity and within the right
sylvian fissure and overlying both frontal lobes. There is mild
volume loss with confluent periventricular hypoattenuation. No mass
effect or hydrocephalus. No intraventricular extension of
hemorrhage.

Vascular: The distal basilar artery is hyperdense, but is difficult
to separate from adjacent hemorrhage on some of the images. No
advanced atherosclerotic calcification of the arteries at the skull
base.

Skull: Posterior left parietal scalp hematoma.  No skull fracture.

Sinuses/Orbits: No sinus fluid levels or advanced mucosal
thickening. No mastoid effusion. Normal orbits.

CERVICAL SPINE FINDINGS

Alignment: No static subluxation. Facets are aligned. Occipital
condyles and the lateral masses of C1 and C2 are normally
approximated.

Skull base and vertebrae: Intermediate height loss of the C7
vertebra without adjacent soft tissue abnormality. No other evidence
of fracture.

Soft tissues and spinal canal: No prevertebral fluid or swelling. No
visible canal hematoma.

Disc levels: There is multilevel facet hypertrophy, worst at right
C2-3 and C3-4. No bony spinal canal stenosis.

Upper chest: No pneumothorax, pulmonary nodule or pleural effusion.

Other: Normal visualized paraspinal cervical soft tissues.
IMPRESSION: 1. Large volume subarachnoid hemorrhage, predominantly located in
the anterior basal cisterns and left sylvian fissure, but also
layering over both frontal lobes in the lateral right convexity,
extending into the right sylvian fissure. In the context of fall
with head trauma, it is possible that this is a posttraumatic
subarachnoid hemorrhage; however, the amount of blood in the basal
cisterns and sylvian fissures with the hyperdense appearance of the
distal basilar artery is concerning for ruptured aneurysm. CTA is
recommended.
2. No significant mass effect or hydrocephalus. No intraventricular
extension of blood.
3. Compression deformity of the C7 vertebra is suspected to be
chronic. No acute fracture or static subluxation of the cervical
spine.

Critical Value/emergent results were called by telephone at the time
of interpretation on 09/05/2017 at [DATE] to Dr. Marsya, who verbally
acknowledged these results.
